# Patient Record
Sex: Female | Born: 1945 | Race: White | Hispanic: No | Marital: Married | Smoking: Former smoker
Health system: Southern US, Community
[De-identification: ages and names within clinical notes are randomized; demographics above are authoritative.]

## PROBLEM LIST (undated history)

## (undated) DIAGNOSIS — K219 Gastro-esophageal reflux disease without esophagitis: Secondary | ICD-10-CM

## (undated) DIAGNOSIS — Z860100 Personal history of colon polyps, unspecified: Secondary | ICD-10-CM

## (undated) DIAGNOSIS — E785 Hyperlipidemia, unspecified: Secondary | ICD-10-CM

## (undated) DIAGNOSIS — K649 Unspecified hemorrhoids: Secondary | ICD-10-CM

## (undated) DIAGNOSIS — Z8719 Personal history of other diseases of the digestive system: Secondary | ICD-10-CM

## (undated) DIAGNOSIS — G4730 Sleep apnea, unspecified: Secondary | ICD-10-CM

## (undated) HISTORY — PX: OTHER SURGICAL HISTORY: SHX169

## (undated) HISTORY — PX: REDUCTION MAMMAPLASTY: SUR839

## (undated) HISTORY — DX: Gastro-esophageal reflux disease without esophagitis: K21.9

## (undated) HISTORY — DX: Personal history of other diseases of the digestive system: Z87.19

## (undated) HISTORY — DX: Sleep apnea, unspecified: G47.30

## (undated) HISTORY — DX: Personal history of colonic polyps: Z86.010

## (undated) HISTORY — DX: Hyperlipidemia, unspecified: E78.5

## (undated) HISTORY — PX: CLOSED REDUCTION PROXIMAL ULNAR FRACTURE: SUR236

## (undated) HISTORY — DX: Personal history of colon polyps, unspecified: Z86.0100

## (undated) HISTORY — DX: Unspecified hemorrhoids: K64.9

## (undated) HISTORY — PX: BREAST REDUCTION SURGERY: SHX8

---

## 1985-10-11 HISTORY — PX: PARTIAL HYSTERECTOMY: SHX80

## 2000-02-26 ENCOUNTER — Encounter: Admission: RE | Admit: 2000-02-26 | Discharge: 2000-02-26 | Payer: Self-pay | Admitting: *Deleted

## 2000-04-12 ENCOUNTER — Encounter: Payer: Self-pay | Admitting: Internal Medicine

## 2000-05-03 ENCOUNTER — Ambulatory Visit (HOSPITAL_COMMUNITY): Admission: RE | Admit: 2000-05-03 | Discharge: 2000-05-03 | Payer: Self-pay | Admitting: Internal Medicine

## 2000-05-03 ENCOUNTER — Encounter: Payer: Self-pay | Admitting: Internal Medicine

## 2000-05-03 ENCOUNTER — Encounter (INDEPENDENT_AMBULATORY_CARE_PROVIDER_SITE_OTHER): Payer: Self-pay | Admitting: *Deleted

## 2001-02-13 ENCOUNTER — Other Ambulatory Visit: Admission: RE | Admit: 2001-02-13 | Discharge: 2001-02-13 | Payer: Self-pay | Admitting: *Deleted

## 2001-08-21 ENCOUNTER — Encounter: Admission: RE | Admit: 2001-08-21 | Discharge: 2001-08-21 | Payer: Self-pay

## 2001-11-07 ENCOUNTER — Other Ambulatory Visit: Admission: RE | Admit: 2001-11-07 | Discharge: 2001-11-07 | Payer: Self-pay | Admitting: Obstetrics and Gynecology

## 2004-06-30 ENCOUNTER — Other Ambulatory Visit: Admission: RE | Admit: 2004-06-30 | Discharge: 2004-06-30 | Payer: Self-pay | Admitting: Family Medicine

## 2005-07-06 ENCOUNTER — Other Ambulatory Visit: Admission: RE | Admit: 2005-07-06 | Discharge: 2005-07-06 | Payer: Self-pay | Admitting: Family Medicine

## 2006-02-22 ENCOUNTER — Encounter: Admission: RE | Admit: 2006-02-22 | Discharge: 2006-02-22 | Payer: Self-pay | Admitting: Internal Medicine

## 2007-06-15 ENCOUNTER — Encounter (INDEPENDENT_AMBULATORY_CARE_PROVIDER_SITE_OTHER): Payer: Self-pay | Admitting: Orthopedic Surgery

## 2007-06-15 ENCOUNTER — Ambulatory Visit (HOSPITAL_BASED_OUTPATIENT_CLINIC_OR_DEPARTMENT_OTHER): Admission: RE | Admit: 2007-06-15 | Discharge: 2007-06-16 | Payer: Self-pay | Admitting: Orthopedic Surgery

## 2008-04-11 ENCOUNTER — Encounter: Payer: Self-pay | Admitting: Internal Medicine

## 2008-04-23 ENCOUNTER — Telehealth: Payer: Self-pay | Admitting: Internal Medicine

## 2008-04-23 DIAGNOSIS — R109 Unspecified abdominal pain: Secondary | ICD-10-CM | POA: Insufficient documentation

## 2008-04-23 DIAGNOSIS — Z860100 Personal history of colon polyps, unspecified: Secondary | ICD-10-CM | POA: Insufficient documentation

## 2008-04-23 DIAGNOSIS — K649 Unspecified hemorrhoids: Secondary | ICD-10-CM | POA: Insufficient documentation

## 2008-04-24 ENCOUNTER — Ambulatory Visit: Payer: Self-pay | Admitting: Internal Medicine

## 2008-04-24 ENCOUNTER — Encounter: Admission: RE | Admit: 2008-04-24 | Discharge: 2008-04-24 | Payer: Self-pay | Admitting: Internal Medicine

## 2008-04-24 DIAGNOSIS — R110 Nausea: Secondary | ICD-10-CM

## 2008-04-25 ENCOUNTER — Encounter: Admission: RE | Admit: 2008-04-25 | Discharge: 2008-04-25 | Payer: Self-pay | Admitting: Internal Medicine

## 2008-04-26 ENCOUNTER — Telehealth: Payer: Self-pay | Admitting: Internal Medicine

## 2008-06-24 ENCOUNTER — Telehealth: Payer: Self-pay | Admitting: Internal Medicine

## 2008-06-26 ENCOUNTER — Ambulatory Visit: Payer: Self-pay | Admitting: Internal Medicine

## 2008-06-27 ENCOUNTER — Ambulatory Visit: Payer: Self-pay | Admitting: Internal Medicine

## 2008-06-27 ENCOUNTER — Encounter: Payer: Self-pay | Admitting: Internal Medicine

## 2008-07-01 ENCOUNTER — Encounter: Payer: Self-pay | Admitting: Internal Medicine

## 2008-08-05 ENCOUNTER — Encounter: Payer: Self-pay | Admitting: Internal Medicine

## 2008-08-05 ENCOUNTER — Telehealth: Payer: Self-pay | Admitting: Internal Medicine

## 2008-08-05 DIAGNOSIS — R1013 Epigastric pain: Secondary | ICD-10-CM

## 2008-08-06 ENCOUNTER — Ambulatory Visit: Payer: Self-pay | Admitting: Internal Medicine

## 2009-10-08 ENCOUNTER — Encounter: Admission: RE | Admit: 2009-10-08 | Discharge: 2009-10-08 | Payer: Self-pay | Admitting: Orthopedic Surgery

## 2009-10-14 ENCOUNTER — Ambulatory Visit (HOSPITAL_BASED_OUTPATIENT_CLINIC_OR_DEPARTMENT_OTHER): Admission: RE | Admit: 2009-10-14 | Discharge: 2009-10-14 | Payer: Self-pay | Admitting: Orthopedic Surgery

## 2010-04-16 ENCOUNTER — Encounter (INDEPENDENT_AMBULATORY_CARE_PROVIDER_SITE_OTHER): Payer: Self-pay | Admitting: *Deleted

## 2010-08-06 ENCOUNTER — Encounter (INDEPENDENT_AMBULATORY_CARE_PROVIDER_SITE_OTHER): Payer: Self-pay | Admitting: *Deleted

## 2010-09-30 ENCOUNTER — Encounter (INDEPENDENT_AMBULATORY_CARE_PROVIDER_SITE_OTHER): Payer: Self-pay | Admitting: *Deleted

## 2010-10-06 ENCOUNTER — Ambulatory Visit: Payer: Self-pay | Admitting: Internal Medicine

## 2010-10-28 ENCOUNTER — Telehealth: Payer: Self-pay | Admitting: Internal Medicine

## 2010-10-30 ENCOUNTER — Ambulatory Visit: Admit: 2010-10-30 | Payer: Self-pay | Admitting: Internal Medicine

## 2010-11-12 NOTE — Progress Notes (Signed)
Summary: [facility]  [facility]   Imported By: Lanelle Bal 03/26/2010 10:54:22  _____________________________________________________________________  External Attachment:    Type:   Image     Comment:   External Document

## 2010-11-12 NOTE — Miscellaneous (Signed)
Summary: [facility] previsit  Clinical Lists Changes  Medications: Added new medication of DULCOLAX 5 MG  TBEC (BISACODYL) Day before procedure take 2 at 3pm and 2 at 8pm. - Signed Added new medication of METOCLOPRAMIDE HCL 10 MG  TABS (METOCLOPRAMIDE HCL) As per prep instructions. - Signed Added new medication of MIRALAX   POWD (POLYETHYLENE GLYCOL 3350) As per prep  instructions. - Signed Rx of DULCOLAX 5 MG  TBEC (BISACODYL) Day before procedure take 2 at 3pm and 2 at 8pm.;  #4 x 0;  Signed;  Entered by: Karl Bales RN;  Authorized by: Hart Carwin MD;  Method used: Electronically to [facility]*, [address] Shipping/mailing, [place], [place]  [address], Ph: [phone], Fax: [phone] Rx of METOCLOPRAMIDE HCL 10 MG  TABS (METOCLOPRAMIDE HCL) As per prep instructions.;  #2 x 0;  Signed;  Entered by: Karl Bales RN;  Authorized by: Hart Carwin MD;  Method used: Electronically to [facility]*, [address], [address] Shipping/mailing, [place], [place]  [address], Ph: [phone], Fax: [phone] Rx of MIRALAX   POWD (POLYETHYLENE GLYCOL 3350) As per prep  instructions.;  #255gm x 0;  Signed;  Entered by: Karl Bales RN;  Authorized by: Hart Carwin MD;  Method used: Electronically to [facility]*, [address] Shipping/mailing, [place], [place]  [address], Ph: [phone], Fax: [phone]    Prescriptions: MIRALAX   POWD (POLYETHYLENE GLYCOL 3350) As per prep  instructions.  #255gm x 0   Entered by:   Karl Bales RN   Authorized by:   Hart Carwin MD   Signed by:   Karl Bales RN on 10/08/2010   Method used:   Electronically to        [facility]* (retail)       [address]       [address] Shipping/mailing       [place], [place]  [address]       Ph: [phone]       Fax: [phone]   RxID:   [id number] METOCLOPRAMIDE HCL 10 MG  TABS  (METOCLOPRAMIDE HCL) As per prep instructions.  #2 x 0   Entered by:   Karl Bales RN   Authorized by:   Hart Carwin MD   Signed by:   Karl Bales RN on 10/08/2010   Method used:   Electronically to        [facility]* (retail)       [address]       [address] Shipping/mailing       [place], [place]  [address]       Ph: [phone]       Fax: [phone]   RxID:   [id number] DULCOLAX 5 MG  TBEC (BISACODYL) Day before procedure take 2 at 3pm and 2 at 8pm.  #4 x 0   Entered by:   Karl Bales RN   Authorized by:   Hart Carwin MD   Signed by:   Karl Bales RN on 10/08/2010   Method used:   Electronically to        [facility]* (retail)       [address]       [address] Shipping/mailing       [place], [place]  [address]  Ph: [phone]       Fax: [phone]   RxID:   [id number]

## 2010-11-12 NOTE — Progress Notes (Signed)
Summary: Resch'd COL  Phone Note Call from Patient Call back at Home Phone [phone]   Caller: Patient Call For: Dr. Juanda Chance Summary of Call: pt. r/s her COL from 10-30-10 until 12-08-10 b/c her husband has to attend the [facility] on Thursday night and she would not be able to prep. Would you like pt. charged the cancelation fee? Initial call taken by: Karna Christmas,  October 28, 2010 10:55 AM  Follow-up for Phone Call        no charge, please. DB Follow-up by: Hart Carwin MD,  October 28, 2010 11:45 AM

## 2010-11-12 NOTE — Letter (Signed)
Summary: Colonoscopy Letter   Gastroenterology  [address] [place], [place] [address]   Phone: [phone]  Fax: [phone]      April 16, 2010 MRN: [id number]   [facility] [address] [place], [place]  [address]   Dear Ms. Loughner,   According to your medical record, it is time for you to schedule a Colonoscopy. The American Cancer Society recommends this procedure as a method to detect early colon cancer. Patients with a family history of colon cancer, or a personal history of colon polyps or inflammatory bowel disease are at increased risk.  This letter has beeen generated based on the recommendations made at the time of your procedure. If you feel that in your particular situation this may no longer apply, please contact our office.  Please call our office at [phone] to schedule this appointment or to update your records at your earliest convenience.  Thank you for cooperating with [place] to provide you with the very best care possible.   Sincerely,  Hedwig Morton. Juanda Chance, M.D.  [facility]astroenterology [facility] [phone]

## 2010-11-12 NOTE — Letter (Signed)
Summary: [facility] Instructions  [facility]  [address] [place], [place] [address]   Phone: [phone]  Fax: [phone]       Stacey Short    1946-02-19    MRN: [id number]       Procedure Day Dorna Bloom:  Lenor Coffin  10/22/10     Arrival Time: 9:30AM     Procedure Time:  10:30AM     Location of Procedure:                    Juliann Pares  [facility] (4th Floor)   PREPARATION FOR COLONOSCOPY WITH MIRALAX  Starting 5 days prior to your procedure 10/17/10 do not eat nuts, seeds, popcorn, corn, beans, peas,  salads, or any raw vegetables.  Do not take any fiber supplements (e.g. Metamucil, Citrucel, and Benefiber). ____________________________________________________________________________________________________   THE DAY BEFORE YOUR PROCEDURE         DATE: 10/21/10   DAY: WEDNESDAY  1   Drink clear liquids the entire day-NO SOLID FOOD  2   Do not drink anything colored red or purple.  Avoid juices with pulp.  No orange juice.  3   Drink at least 64 oz. (8 glasses) of fluid/clear liquids during the day to prevent dehydration and help the prep work efficiently.  CLEAR LIQUIDS INCLUDE: Water Jello Ice Popsicles Tea (sugar ok, no milk/cream) Powdered fruit flavored drinks Coffee (sugar ok, no milk/cream) Gatorade Juice: apple, white grape, white cranberry  Lemonade Clear bullion, consomm, broth Carbonated beverages (any kind) Strained chicken noodle soup Hard Candy  4   Mix the entire bottle of Miralax with 64 oz. of Gatorade/Powerade in the morning and put in the refrigerator to chill.  5   At 3:00 pm take 2 Dulcolax/Bisacodyl tablets.  6   At 4:30 pm take one Reglan/Metoclopramide tablet.  7  Starting at 5:00 pm drink one 8 oz glass of the Miralax mixture every 15-20 minutes until you have finished drinking the entire 64 oz.  You should finish drinking prep around 7:30 or 8:00 pm.  8   If you are nauseated, you may take the 2nd Reglan/Metoclopramide  tablet at 6:30 pm.        9    At 8:00 pm take 2 more DULCOLAX/Bisacodyl tablets.     THE DAY OF YOUR PROCEDURE      DATE:  10/22/10   DAY:  Lenor Coffin  You may drink clear liquids until 8:30AM (2 HOURS BEFORE PROCEDURE).   MEDICATION INSTRUCTIONS  Unless otherwise instructed, you should take regular prescription medications with a small sip of water as early as possible the morning of your procedure.         OTHER INSTRUCTIONS  You will need a responsible adult at least 64 years of age to accompany you and drive you home.   This person must remain in the waiting room during your procedure.  Wear loose fitting clothing that is easily removed.  Leave jewelry and other valuables at home.  However, you may wish to bring a book to read or an iPod/MP3 player to listen to music as you wait for your procedure to start.  Remove all body piercing jewelry and leave at home.  Total time from sign-in until discharge is approximately 2-3 hours.  You should go home directly after your procedure and rest.  You can resume normal activities the day after your procedure.  The day of your procedure you should not:   Drive  Make legal decisions   Operate machinery   Drink alcohol   Return to work  You will receive specific instructions about eating, activities and medications before you leave.   The above instructions have been reviewed and explained to me by   Karl Bales RN  October 08, 2010 9:29 AM    I fully understand and can verbalize these instructions _____________________________ Date _______

## 2010-11-12 NOTE — Letter (Signed)
Summary: Pre Visit Letter Revised  [facility]  [address] [place], [place] [address]   Phone: [phone]  Fax: [phone]        08/06/2010 MRN: [id number] [facility] [address] [place], [place]  [address]  [place]             Procedure Date:  09-15-10   Welcome to the Gastroenterology Division at [facility].    You are scheduled to see a nurse for your pre-procedure visit on 09-07-10 at 9:30A.M. on the 3rd floor at [facility], [address]  We ask that you try to arrive at our office 15 minutes prior to your appointment time to allow for check-in.  Please take a minute to review the attached form.  If you answer "Yes" to one or more of the questions on the first page, we ask that you call the person listed at your earliest opportunity.  If you answer "No" to all of the questions, please complete the rest of the form and bring it to your appointment.    Your nurse visit will consist of discussing your medical and surgical history, your immediate family medical history, and your medications.   If you are unable to list all of your medications on the form, please bring the medication bottles to your appointment and we will list them.  We will need to be aware of both prescribed and over the counter drugs.  We will need to know exact dosage information as well.    Please be prepared to read and sign documents such as consent forms, a financial agreement, and acknowledgement forms.  If necessary, and with your consent, a friend or relative is welcome to sit-in on the nurse visit with you.  Please bring your insurance card so that we may make a copy of it.  If your insurance requires a referral to see a specialist, please bring your referral form from your primary care physician.  No co-pay is required for this nurse visit.     If you cannot keep your appointment, please call [phone] to cancel or reschedule prior to your appointment date.  This  allows [place] the opportunity to schedule an appointment for another patient in need of care.    Thank you for choosing [facility] for your medical needs.  We appreciate the opportunity to care for you.  Please visit [place] at our website  to learn more about our practice.  Sincerely, The Gastroenterology Division

## 2010-12-07 ENCOUNTER — Telehealth: Payer: Self-pay | Admitting: Internal Medicine

## 2010-12-08 ENCOUNTER — Encounter: Payer: Self-pay | Admitting: Internal Medicine

## 2010-12-08 ENCOUNTER — Other Ambulatory Visit (AMBULATORY_SURGERY_CENTER): Payer: 59 | Admitting: Internal Medicine

## 2010-12-08 DIAGNOSIS — Z1211 Encounter for screening for malignant neoplasm of colon: Secondary | ICD-10-CM

## 2010-12-17 NOTE — Progress Notes (Signed)
Summary: Prep questions  Phone Note Call from Patient Call back at Home Phone [phone]   Caller: Patient Call For: Dr. Juanda Chance Reason for Call: Talk to Nurse Summary of Call: Patient has questions about her prep since she changed the time of her procedure Initial call taken by: [place] Johnson,  December 07, 2010 1:09 PM  Follow-up for Phone Call        Miralax prep; times therefore will not change. Follow-up by: Doristine Church RN II,  December 07, 2010 1:14 PM

## 2010-12-17 NOTE — Procedures (Signed)
Summary: Colonoscopy  Patient: [name] Note: All result statuses are Final unless otherwise noted.  Tests: (1) Colonoscopy (COL)   COL Colonoscopy           DONE     [facility]     [address]     [place], [place]  [address]           COLONOSCOPY PROCEDURE REPORT           PATIENT:  Stacey, Short  MR#:  [id number]     BIRTHDATE:  05-14-1946, 64 yrs. old  GENDER:  female     ENDOSCOPIST:  Hedwig Morton. Juanda Chance, MD     REF. BY:  Rodrigo Ran, M.D.     PROCEDURE DATE:  12/08/2010     PROCEDURE:  Colonoscopy [address]1027     ASA CLASS:  Class II     INDICATIONS:  Routine Risk Screening hyperplastic polyp in 2001     MEDICATIONS:   Versed 10 mg, Fentanyl 100 mcg           DESCRIPTION OF PROCEDURE:   After the risks benefits and     alternatives of the procedure were thoroughly explained, informed     consent was obtained.  Digital rectal exam was performed and     revealed no rectal masses.   The LB [id number]-[id number] endoscope     was introduced through the anus and advanced to the cecum, which     was identified by both the appendix and ileocecal valve, without     limitations.  The quality of the prep was good, using MiraLax.     The instrument was then slowly withdrawn as the colon was fully     examined.     <<PROCEDUREIMAGES>>           FINDINGS:  No polyps or cancers were seen (see image1, image2,     image3, image4, image5, and image6).   Retroflexed views in the     rectum revealed no abnormalities.    The scope was then withdrawn     from the patient and the procedure completed.           COMPLICATIONS:  None     ENDOSCOPIC IMPRESSION:     1) No polyps or cancers     2) Normal colonoscopy     RECOMMENDATIONS:     1) high fiber diet     REPEAT EXAM:  In 10 year(s) for.           ______________________________     Hedwig Morton. Juanda Chance, MD           CC:           n.     eSIGNED:   Hedwig Morton. Marlane Hirschmann at 12/08/2010 02:50 PM           Karmen Bongo, [id number]  Note:  An exclamation mark (!) indicates a result that was not dispersed into the flowsheet. Document Creation Date: 12/08/2010 2:50 PM _______________________________________________________________________  (1) Order result status: Final Collection or observation date-time: 12/08/2010 14:44 Requested date-time:  Receipt date-time:  Reported date-time:  Referring Physician:   Ordering Physician: Lina Sar [phone]) Specimen Source:  Source: Launa Grill Order Number: [id number] Lab site:   Appended Document: Colonoscopy    Clinical Lists Changes  Observations: Added new observation of COLONNXTDUE: 11/2020 (12/08/2010 15:45)

## 2010-12-26 LAB — POCT HEMOGLOBIN-HEMACUE: Hemoglobin: 12.2 g/dL (ref 12.0–15.0)

## 2011-02-23 NOTE — Op Note (Signed)
[name], Short               ACCOUNT NO.:  [id number]   MEDICAL RECORD NO.:  [id number]          PATIENT TYPE:  AMB   LOCATION:  DSC                          FACILITY:  [facility]   PHYSICIAN:  Katy Fitch. Sypher, M.D. DATE OF BIRTH:  1945/11/11   DATE OF PROCEDURE:  06/15/2007  DATE OF DISCHARGE:                               OPERATIVE REPORT   PREOPERATIVE DIAGNOSIS:  Painful left thumb carpometacarpal arthrosis  with plain film documentation of Eaton stage III degenerative change.   POSTOPERATIVE DIAGNOSIS:  Painful left thumb carpometacarpal arthrosis  with plain film documentation of Eaton stage III degenerative change,  with identification of large cartilaginous loose body within  carpometacarpal joint and Eaton stage IV changes.   OPERATION:  1. Left trapeziectomy.  2. Synovectomy and removal of loose bodies from left thumb      carpometacarpal joint.  3. Reconstruction of left thumb with a distally based flexor carpi      radialis intermetacarpal ligament reconstruction, augmented by a 0      Vicryl suturedesis.   OPERATING SURGEON:  Josephine Igo, M.D.   ASSISTANT:  Molly Maduro Dasnoit PA-C.   ANESTHESIA:  Left infraclavicular block, supervising anesthesiologist  Dr. Sampson Goon.   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   We have been following her in the office with identification of Eaton  stage III radiographic changes.  She has responded to splinting,  activity modification, anti-inflammatory medication and steroid  injections.   Recently she reached a point that she no longer responded to steroid  injection and splinting and requested that we proceed to reconstruction  of her left thumb CMC joint.   After informed consent, she is brought to the operating room at this  time.   Preoperatively she was noted to have a diminutive palmaris longus.  We  advised  her that we would proceed with trapeziectomy,  joint debridement  and probable intermetacarpal ligament reconstruction utilizing the  flexor carpi radialis.   Rather than using Kirschner wires for temporary stabilization, we  advised that we would use an absorbable suturedesis.   After informed consent, she is brought to the operating room at this  time.   PROCEDURE:  Stacey Short is brought to operating room and placed in  supine position on the operating table.   Following an anesthesia consult with Dr. Sampson Goon, a left  infraclavicular block was placed without complication.  Excellent  anesthesia of the left upper extremity was obtained.   Stacey Short was transferred to room 5, placed in supine position on the  table and under Dr. Jarrett Ables direct supervision, sedation provided.   The left arm was prepped with Betadine soap solution, sterilely draped.  A pneumatic tourniquet was applied to the proximal brachium.  1 gram of  Ancef was administered in the holding area and OR preoperatively.   Following exsanguination left arm with Esmarch bandage, arterial  tourniquet was inflated to 220 mmHg.   The procedure commenced with a curvilinear incision paralleling the  glabrous skin of the thenar eminence.  Subcutaneous tissues  were  carefully divided meticulously avoiding the radial superficial sensory  branches.   The thenar musculature and the most palmar branch of the abductor  pollicis longus was elevated exposing the capsule of the CMC joint.  The  capsule was noted to be edematous with a small ganglion protruding  through the capsule.   After the capsule was incised longitudinally.  The trapezium was exposed  subperiosteally with use of a 64 Beaver blade and a sharp 4-mm  osteotome.   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  This would  stage Stacey Short arthritis at [facility] stage  IV.   The trapezium was morselized with a rongeur and removed piecemeal.  Care  was taken to preserve the flexor carpi radialis tendon.  After complete  synovectomy, the beak osteophyte at the base of the thumb metacarpal was  removed with a rongeur.   Drill holes were created through the base of the index metacarpal at the  metacarpal ligament anatomic origin and to the base of thumb metacarpal.  The drill holes were enlarged to 3.5 mm by sequential hand drilling.  Approximately 40% of the flexor carpi radialis was harvested through a  short transverse incision on the volar aspect the forearm of the  musculotendinous junction.  The tendon was separated by blunt dissection  and a Carroll tendon passing forceps was used to harvest a distally  based tendon graft measuring approximately 5 mm in width.   This was passed distally into the cavity created by trapezium resection  and the flexor carpi radialis split to the level of its insertion.  The  flexor carpi radialis was then brought through the base of the index  metacarpal and secured with a 3-0 FiberWire to the dorsal periosteum and  extensor carpi radialis longus tendon insertion, creating an anchor for  the flexor carpi radialis graft.  The tendon was then brought back into  the cavity created by resection of the trapezium and use an  intermetacarpal ligament reconstruction by looping through the base of  the thumb metacarpal and being brought back for double overhand knots to  itself creating an intermetacarpal ligament reconstruction.  This was  secured with multiple interrupted sutures of 3-0 Ethilon at proper  tension to suspend the thumb at an anatomic height.   A pair of 0 Vicryl sutures were then brought from dorsal attachment at  the extensor carpi radialis longus through the base of thumb metacarpal  drill hole and tensioned on the thenar muscle fascia creating a  suturedesis suspending the thumb.   This technique has  been a very useful adjunct to intermetacarpal  ligament reconstruction without utilizing Kirschner wires.   Our goal was to have the suture absorb in approximately 8 to 10 weeks  once the tendon grafts have healed in position.   A very satisfactory suspension was achieved.  Care was taken to not  overtighten the suturedesis by ensuring that the thumb metacarpal could  be adducted against the index metacarpal fully.   After tensioning the suture, the thenar muscles were repaired to the  fascia and periosteum of the thumb metacarpal.   A very satisfactory construct was achieved.   All wounds irrigated with sterile saline followed by repair with  intradermal 3-0 Prolene and Steri-Strips.   The wounds were dressed with Xeroflo sterile gauze and a  thumb spica  splint.  There no apparent complications.   Stacey Short tolerated surgery and anesthesia well.  She is transferred to  recovery room with stable vital signs.   For aftercare she will be admitted to recovery care center for  observation of her vital signs and appropriate analgesics in the form of  p.o. and IV Dilaudid as well as IV PCA morphine as needed.  She will  provided Ancef 1 gram q.8 h. for total of three doses.      Katy Fitch Sypher, M.D.  Electronically Signed     RVS/MEDQ  D:  06/15/2007  T:  06/15/2007  Job:  [id number]   cc:   Talmadge Coventry, M.D.  Lenon Curt Chilton Si, M.D.

## 2011-02-26 NOTE — H&P (Signed)
[place]  Patient:    Stacey Short, Stacey Short                        MRN: [id number] Attending:  Hedwig Morton. Juanda Chance, M.D. [facility]                         History and Physical  ADDENDUM:  Copy of dictation to Dr. Bluford Main with [facility]. DD:  05/03/00 TD:  05/03/00 Job: [id number]

## 2011-02-26 NOTE — Procedures (Signed)
[place]  Patient:    Stacey Short, Stacey Short                        MRN: [id number] Proc. Date: 05/03/00 Attending:  Hedwig Morton. Juanda Chance, M.D. [facility]                           Procedure Report  PROCEDURE:  Upper endoscopy and colonoscopy.  ENDOSCOPIST:  Hedwig Morton. Juanda Chance, M.D. [facility]  INDICATIONS:  This 65 year old white female has a history of gastroesophageal reflux.  She has been recently on Aciphex 20 mg initially on a daily basis but has been able to taper it off to p.r.n.  She denies any dysphagia or odynophagia.  She his undergoing upper endoscopy to rule out Barretts esophagus.  She is also undergoing colonoscopy for neoplastic screening. There is no family history of colon cancer, but she had a hemoccult-positive stool on exam and has a history of hemorrhoids.  She has had occasional bright red blood per rectum.  On my physical on April 12, 2000, the initial exam showed trace hemoccult-positive stool, but on repeat, she was hemoccult negative. She has not been anemic.  ENDOSCOPE:  Fujinon single-channel endoscope.  SEDATION:  Versed 10 mg IV, Demerol 25 mg IV.  DESCRIPTION OF PROCEDURE:  The Fujinon single-channel endoscope was passed under direct vision through the posterior pharynx into the esophagus.  The patient was monitored by pulse oximeter.  Her oxygen saturations were normal. Her gag reflex was preserved.  Proximal, mid, and distal esophageal mucosa was completely normal with no evidence of acid reflux.  There was no fibrosis or erosions.  Squamocolumnar junction was somewhat irregular but essentially within normal limits.  There as nothing to suggest Barretts esophagus or hiatal hernia.  Stomach: The stomach was insufflated with air.  Gastric folds were unremarkable.  Gastric antrum showed mild erythema throughout.  It was not clear whether this was normal variation or if there was ______ .  Biopsies were taken for CLOtest.  ______ was  unremarkable.  Retroflexion of endoscope revealed normal fundus and cardia.  Duodenum:  The duodenal bulb and descending duodenum were normal.  IMPRESSION: 1. Essentially normal upper endoscopy of esophagus, stomach, and duodenum. 2. Status post CLOtest.  PLAN:  The patient is to continue Aciphex and Tums on p.r.n. basis.  No further evaluation needed at this time.  PROCEDURE:  Colonoscopy.  ENDOSCOPE:  Fujinon single-channel colonoscope.  SEDATION:   Additional Demerol 75 mg IV.  DESCRIPTION OF PROCEDURE:  Fujinon single-channel colonoscope was passed under direct vision to the sigmoid colon.  The patient was again monitored by pulse oximetry.  Her oxygen saturation remained normal.  There were small, first grade hemorrhoids in the anal canal.  Rectal tone was slightly decreased. Rectal ampulla was normal.  Rectosigmoid colon mucosa appeared normal.  There were no diverticula.  Tiny, flat-appearing polyp was found at 17 cm from the rectal os and was biopsied and removed by cold biopsy.  Another irregular nodularity of the sigmoid mucosa was noted at 20 cm from the rectum and was also removed by cold biopsy.  Splenic flexure, transverse colon, and hepatic flexure were traversed without difficulty and showed normal mucosa.  Cecal pouch was reached and showed normal distance of cecal pouch with normal ileocecal valve and appendiceal opening.  Video photographs were obtained. Colonoscope was then retracted and colon decompressed.  The patient tolerated the procedure  well.  IMPRESSION: 1. Diminutive polyps of the left colon status post polypectomy. 2. First grade hemorrhoids.  PLAN:  The hemoccult-positive stool was most likely related to the presence of hemorrhoids.  None of the lesions mentioned in the colonoscopy should cause hemoccult-positive stool.  Since she is not anemic, no further evaluation is necessary.  I would suggest screening with hemoccults on a yearly basis or  at least every two years.  Repeat colonoscopy in 10 years.  Treat hemorrhoids symptomatically with Anusol HG suppositories. DD:  05/03/00 TD:  05/03/00 Job: [id number]

## 2011-09-06 ENCOUNTER — Other Ambulatory Visit: Payer: Self-pay | Admitting: Internal Medicine

## 2011-09-06 DIAGNOSIS — Z1231 Encounter for screening mammogram for malignant neoplasm of breast: Secondary | ICD-10-CM

## 2011-09-07 ENCOUNTER — Other Ambulatory Visit: Payer: Self-pay | Admitting: Internal Medicine

## 2011-09-07 ENCOUNTER — Ambulatory Visit
Admission: RE | Admit: 2011-09-07 | Discharge: 2011-09-07 | Disposition: A | Discharge status: Home / Self Care | Payer: 59 | Source: Ambulatory Visit | Attending: Internal Medicine | Admitting: Internal Medicine

## 2011-09-07 DIAGNOSIS — Z1231 Encounter for screening mammogram for malignant neoplasm of breast: Secondary | ICD-10-CM

## 2011-09-20 ENCOUNTER — Ambulatory Visit
Admission: RE | Admit: 2011-09-20 | Discharge: 2011-09-20 | Disposition: A | Discharge status: Home / Self Care | Payer: 59 | Source: Ambulatory Visit | Attending: Internal Medicine | Admitting: Internal Medicine

## 2011-09-20 ENCOUNTER — Other Ambulatory Visit: Payer: Self-pay | Admitting: Internal Medicine

## 2011-09-20 DIAGNOSIS — Z1231 Encounter for screening mammogram for malignant neoplasm of breast: Secondary | ICD-10-CM

## 2012-01-18 ENCOUNTER — Encounter: Payer: Self-pay | Admitting: Sports Medicine

## 2012-01-18 ENCOUNTER — Ambulatory Visit (INDEPENDENT_AMBULATORY_CARE_PROVIDER_SITE_OTHER): Payer: 59 | Admitting: Sports Medicine

## 2012-01-18 VITALS — BP 138/87 | HR 72 | Ht 66.0 in | Wt 154.0 lb

## 2012-01-18 DIAGNOSIS — M722 Plantar fascial fibromatosis: Secondary | ICD-10-CM

## 2012-01-18 NOTE — Patient Instructions (Signed)
It was great to meet you today! We would like you to do the stretches/exercises we have given you two times per day every day, soak your feet in ice water for 20 min two times per day, and take either Aleve or Advil every day for several weeks. Come back to see [place] in 4-6 weeks so we can see if this has made any difference in your symptoms.

## 2012-01-18 NOTE — Assessment & Plan Note (Signed)
This is chronic and may have contributed to the os calcis injuries from change in weight bearing  Try exercises and stretches  He used heel padding whenever possible  Metatarsal pads added to shoes  We could consider custom orthotics if her pain continues but if she improves can continue with this plan  Recheck in 6 weeks

## 2012-01-18 NOTE — Progress Notes (Signed)
Patient ID: Stacey Short, female   DOB: 01/06/1946, 66 y.o.   MRN: [id number] The patient is a 66 year old female who presents today for evaluation of bilateral heel pain. She reports her heel pain began approximately 2 years ago. She initially attributed it to a single pair of shoes and got rid of these. However, over the last 2 years it has progressively worsened. She now has pain in most shoes that she wears.  The patient's pain is located on the plantar aspect of the foot directly under the calcaneus. There is no significant radiation. She reports that the right foot typically bothers her worse than the left foot. She does not report it been particularly worse in the morning, but rather that it worsens throughout the day and with walking. There is no particular radiation of the pain. She has previously suffered from plantar fasciitis and has tried the exercises that helped her recover from this to no effect.  The patient reports that his problem is significantly impacting her life if she is no longer able to walk her dogs nearly as far as she used to. She reports that she has a Morton's neuroma and wears over-the-counter metatarsal pads to good effect.  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  She has slight transverse arch collapse bilaterally.  Good achilles alignment with no obvious gait abnormalities.  Pain on palpation over the origin of the plantar fascia R>L, oscalcis on the right.  MSK [place]: Chronic plantar fascial thickening bilaterally, right thicker than left at 0.8 vs 0.62, bilateral os calcis defects right worse than left;  Rt shows some fragmentation. calcification in left PF near insertion  Assessment/Plan: 1) Chronic plantar fascietis: tx with ice baths, NSAIDs, and stretches.  Handout given. 2) Bilateral oscalsus: Rec OTC heel cups and avoiding shoes without padding.  No other specific tx at this  time.  RTC 4-6 weeks

## 2012-08-17 ENCOUNTER — Other Ambulatory Visit: Payer: Self-pay | Admitting: Internal Medicine

## 2012-08-17 DIAGNOSIS — Z1231 Encounter for screening mammogram for malignant neoplasm of breast: Secondary | ICD-10-CM

## 2012-08-17 DIAGNOSIS — Z9889 Other specified postprocedural states: Secondary | ICD-10-CM

## 2012-09-01 ENCOUNTER — Ambulatory Visit (INDEPENDENT_AMBULATORY_CARE_PROVIDER_SITE_OTHER): Payer: 59 | Admitting: Internal Medicine

## 2012-09-01 VITALS — BP 152/82 | HR 71 | Temp 98.0°F | Resp 16 | Ht 65.0 in | Wt 155.0 lb

## 2012-09-01 DIAGNOSIS — T7840XA Allergy, unspecified, initial encounter: Secondary | ICD-10-CM

## 2012-09-01 DIAGNOSIS — R21 Rash and other nonspecific skin eruption: Secondary | ICD-10-CM

## 2012-09-01 MED ORDER — PREDNISONE 10 MG PO TABS: ORAL_TABLET | ORAL | Status: DC

## 2012-09-01 MED ORDER — METHYLPREDNISOLONE ACETATE 80 MG/ML IJ SUSP
120.0000 mg | Freq: Once | INTRAMUSCULAR | Status: AC
  Administered 2012-09-01: 120 mg via INTRAMUSCULAR

## 2012-09-01 MED ORDER — ALPRAZOLAM 1 MG PO TABS: 1.0000 mg | ORAL_TABLET | Freq: Every evening | ORAL | Status: DC | PRN

## 2012-09-01 NOTE — Patient Instructions (Addendum)
Itching Itching is a symptom that can be caused by many things. These include skin problems (including infections) as well as some internal diseases.  If the itching is affecting just one area of the body, it is most likely due to a common skin problem, such as:  Poison oak and poison ivy.  Contact dermatitis (skin irritation from a plant, chemicals, fiberglass, detergents, new cosmetic, new jewelry, or other substance).  Fungus (such as athlete's foot, jock itch, or ringworm).  Head lice  Dandruff  Insect bite  Infection (such as Shingles or other virus infections). If the itching is all over (widespread), the possible causes are many. These include:   Dry skin or eczema  Heat rash  Hives  Liver disorders  Kidney disorders TREATMENT  Localized itching   Lubrication of the skin. Use an ointment or cream or other unperfumed moisturizers if the skin is dry. Apply frequently, especially after bathing.  Anti-itch medicines. These medications may help control the urge to scratch. Scratching always makes itching worse and increases the chance of getting an infection.  Cortisone creams and ointments. These help reduce the inflammation.  Antibiotics. Skin infections can cause itching. Topical or oral antibiotics may be needed for 10 to 20 days to get rid of an infection. If you can identify what caused the itching, avoid this substance in the future.  Widespread itching  The following measures may help to relieve itching regardless of the cause:   Wash the skin once with soap to remove irritants.  Bathe in tepid water with baking soda, cornstarch, or oatmeal.  Use calamine lotion (nonprescription) or a baking soda solution (1 teaspoon in 4 ounces of water on the skin).  Apply 1% hydrocortisone cream (no prescription needed). Do not use this if there might be a skin infection.  Avoid scratching.  Avoid itchy or tight-fitting clothes.  Avoid excessive heat, sweating,  scented soaps, and swimming pools.  The lubricants, anti-itch medicines, etc. noted above may be helpful for controlling symptoms. SEEK MEDICAL CARE IF:   The itching becomes severe.  Your itch is not better after 1 week of treatment. Contact your caregiver to schedule further evaluation. Document Released: 09/27/2005 Document Revised: 12/20/2011 Document Reviewed: 03/17/2007 Exit[employer] Patient Information 2013 [employer].  

## 2012-09-01 NOTE — Progress Notes (Signed)
  Subjective:    Patient ID: Stacey Short, female    DOB: 1946/01/07, 66 y.o.   MRN: [id number]  HPI Itchy, scaly rash for 1 week mostly on neck, upper back, upper arms/shoulders. No exposure hx, no fever, fatigue. No new meds. Does use advil once and a while. No blisters , no broken skun.   Review of Systems See ros scanned     Objective:   Physical Exam  Vitals reviewed. Constitutional: She is oriented to person, place, and time. She appears well-developed and well-nourished.  HENT:  Mouth/Throat: Oropharynx is clear and moist.  Eyes: EOM are normal. No scleral icterus.  Neck: Neck supple.  Cardiovascular: Normal rate, regular rhythm and normal heart sounds.   Pulmonary/Chest: Effort normal and breath sounds normal.  Neurological: She is alert and oriented to person, place, and time. Coordination normal.  Skin: Skin is intact. Rash noted. No burn and no petechiae noted. Rash is macular and maculopapular. Rash is not nodular and not urticarial. There is erythema.       Rash is re plaques early, then scaly brown rough pigmented older lesions  Psychiatric: She has a normal mood and affect. Her behavior is normal.          Assessment & Plan:  Depomedrol Loratidine/cetirizine Prednisone 6d taper---SED Alprazolam1mg  prn agitation Dermatology prn

## 2012-09-26 ENCOUNTER — Ambulatory Visit: Payer: 59

## 2012-09-29 ENCOUNTER — Ambulatory Visit: Payer: 59

## 2012-11-03 ENCOUNTER — Encounter: Payer: Self-pay | Admitting: Internal Medicine

## 2012-11-03 ENCOUNTER — Other Ambulatory Visit: Payer: Self-pay | Admitting: Internal Medicine

## 2012-11-03 NOTE — Telephone Encounter (Signed)
OK to fill Aciphex 20 mg, #30, 1 po qd

## 2012-11-03 NOTE — Telephone Encounter (Signed)
Dr Juanda Chance, I dont see where we ever gave this patient a script of Aciphex but she states that she needs refill on it. Do you want me to fill rx?

## 2012-11-06 MED ORDER — RABEPRAZOLE SODIUM 20 MG PO TBEC: 20.0000 mg | DELAYED_RELEASE_TABLET | Freq: Every day | ORAL | Status: DC

## 2012-11-06 NOTE — Telephone Encounter (Signed)
Sent Aciphex rx to [facility]. Patient advised.

## 2012-11-13 ENCOUNTER — Telehealth: Payer: Self-pay | Admitting: Internal Medicine

## 2012-11-14 ENCOUNTER — Encounter: Payer: Self-pay | Admitting: *Deleted

## 2012-11-14 ENCOUNTER — Other Ambulatory Visit (INDEPENDENT_AMBULATORY_CARE_PROVIDER_SITE_OTHER): Payer: 59

## 2012-11-14 ENCOUNTER — Ambulatory Visit (INDEPENDENT_AMBULATORY_CARE_PROVIDER_SITE_OTHER): Payer: 59 | Admitting: Physician Assistant

## 2012-11-14 VITALS — BP 120/84 | HR 78 | Ht 66.0 in | Wt 153.0 lb

## 2012-11-14 DIAGNOSIS — Z860100 Personal history of colon polyps, unspecified: Secondary | ICD-10-CM

## 2012-11-14 DIAGNOSIS — R1013 Epigastric pain: Secondary | ICD-10-CM

## 2012-11-14 DIAGNOSIS — Z8719 Personal history of other diseases of the digestive system: Secondary | ICD-10-CM

## 2012-11-14 LAB — COMPREHENSIVE METABOLIC PANEL
AST: 20 U/L (ref 0–37)
Alkaline Phosphatase: 44 U/L (ref 39–117)
BUN: 13 mg/dL (ref 6–23)
Glucose, Bld: 97 mg/dL (ref 70–99)
Sodium: 137 mEq/L (ref 135–145)
Total Bilirubin: 0.6 mg/dL (ref 0.3–1.2)

## 2012-11-14 LAB — CBC WITH DIFFERENTIAL/PLATELET
Eosinophils Absolute: 0.1 10*3/uL (ref 0.0–0.7)
Eosinophils Relative: 2.7 % (ref 0.0–5.0)
HCT: 39.8 % (ref 36.0–46.0)
Lymphs Abs: 1.2 10*3/uL (ref 0.7–4.0)
MCHC: 34.3 g/dL (ref 30.0–36.0)
MCV: 95.1 fl (ref 78.0–100.0)
Monocytes Absolute: 0.4 10*3/uL (ref 0.1–1.0)
Neutrophils Relative %: 65.5 % (ref 43.0–77.0)
Platelets: 198 10*3/uL (ref 150.0–400.0)
WBC: 5.1 10*3/uL (ref 4.5–10.5)

## 2012-11-14 LAB — LIPASE: Lipase: 27 U/L (ref 11.0–59.0)

## 2012-11-14 MED ORDER — SUCRALFATE 1 G PO TABS: ORAL_TABLET | ORAL | Status: DC

## 2012-11-14 NOTE — Progress Notes (Signed)
Reviewed and agree, not clear  From the past if low grade pancreatitis ( EtOH) or peptic symptoms.

## 2012-11-14 NOTE — Patient Instructions (Addendum)
Please go to the basement level to have your labs drawn.  We sent a prescription for Carafate to [facility]. Continue the Aciphex twice daily.  We scheduled the Ultrasound for Thursday 11-16-2012 at [facility] on the 1st floor. Arrive at 7:45 PM  Have nothing to eat or drink after midnight.  You have been scheduled for an endoscopy with propofol. Please follow written instructions given to you at your visit today. If you use inhalers (even only as needed) or a CPAP machine, please bring them with you on the day of your procedure.

## 2012-11-14 NOTE — Progress Notes (Signed)
Subjective:    Patient ID: Stacey Short, female    DOB: 12/11/1945, 67 y.o.   MRN: [id number]  HPI Stacey Short is a very nice 67 year old white female known to Dr. Lina Sar. She last had colonoscopy in February of 2012 which was a normal exam and had an endoscopy for epigastric pain in September of 2009 which showed an acute gastritis. Biopsies were done was negative for H. pylori on the biopsy showed mild chronic gastritis. She is otherwise in good health.  She comes in today with complaints of epigastric pain over the past 3 weeks. She says the pain is constant and achy in nature. She says it feels better initially after eating but then comes right back. She's not having any radiation of her pain into her chest or back, no nausea or vomiting, no fever or chills, and no change in her bowel habits. She denies any nausea or vomiting. She had not been on any new medications antibiotics anti-inflammatories etc. prior to onset of her symptoms. She started on AcipHex and quickly bumped this to twice a day over the past week and a half but has had no improvement in her pain. She's also been trying times which have not offered any relief and has cut back on alcohol as well.    Review of Systems  Constitutional: Negative.   HENT: Negative.   Eyes: Negative.   Respiratory: Negative.   Cardiovascular: Negative.   Gastrointestinal: Positive for abdominal pain.  Genitourinary: Negative.   Musculoskeletal: Negative.   Neurological: Negative.   Hematological: Negative.   Psychiatric/Behavioral: Negative.    Outpatient Encounter Prescriptions as of 11/14/2012  Medication Sig Dispense Refill  . ALPRAZolam (XANAX) 1 MG tablet Take 1 tablet (1 mg total) by mouth at bedtime as needed for sleep.  60 tablet  3  . Misc Natural Products (GLUCOSAMINE CHOND COMPLEX/MSM PO) Take 2 tablets by mouth daily.      . Multiple Vitamins-Minerals (MULTIVITAMIN PO) Take by mouth.      . RABEprazole (ACIPHEX) 20 MG tablet  Take 20 mg by mouth 2 (two) times daily.      . rosuvastatin (CRESTOR) 10 MG tablet Take 5 mg every other day, and 10 mg other days.      Marland Kitchen VITAMIN D, CHOLECALCIFEROL, PO Take 1 tablet by mouth daily.      Marland Kitchen zolpidem (AMBIEN) 10 MG tablet Take 5 mg by mouth at bedtime as needed.      . [DISCONTINUED] RABEprazole (ACIPHEX) 20 MG tablet Take 1 tablet (20 mg total) by mouth daily.  30 tablet  1  . sucralfate (CARAFATE) 1 G tablet Take 1 tab between meals and at bedtime x 14 days  56 tablet  1  . [DISCONTINUED] predniSONE (DELTASONE) 10 MG tablet Take po pc 6-5-4-3-2-1 ea day for allergic rash  21 tablet  0    Allergies  Allergen Reactions  . Celecoxib     REACTION: swelling  . Codeine     REACTION: nausea   Patient Active Problem List  Diagnosis  . HEMORRHOIDS  . Plantar fasciitis, bilateral  . Hx of colonic polyp  . H/O gastritis   History  Substance Use Topics  . Smoking status: Former Smoker    Quit date: 10/11/1982  . Smokeless tobacco: Never Used  . Alcohol Use: Yes   family history includes Diabetes in her sister; Kidney cancer in her sister; and Lymphoma in her father.  There is no history of Colon cancer.  Objective:   Physical Exam  well-developed white female in no acute distress blood pressure 120/84 pulse 78 height 5 foot 6 weight 153. HEENT; nontraumatic normocephalic EOMI PERRLA sclera anicteric,Neck; Supple no JVD, Cardiovascular ;regular rate and rhythm with S1-S2 no murmur or gallop, Pulmonary; clear bilaterally, Abdomen ;soft nondistended bowel sounds are active there is no palpable mass or hepatosplenomegaly she is tender in the epigastrium no guarding. Rectal; exam not done, capture remedies no clubbing cyanosis or edema skin warm and dry, Psych ;mood and affect normal and appropriate        Assessment & Plan:  #67  67 year old female with 3 week history of constant epigastric pain, thus far unrelieved with twice-daily PPI. Cannot rule out peptic ulcer  disease or gastritis though would hope she would be feeling better at this point with twice-daily acid blocker. Will also need to rule out gallbladder or pancreatic disease. #2 colon neoplasia screening-up-to-date on colonoscopy with normal exam February 2012  Plan; CBC with differential, CMETand lipase today Continue AcipHex 20 mg by mouth twice daily before breakfast and before dinner Start Carafate 1 g between meals and at bedtime x2 weeks Avoid all anti-inflammatories and alcohol Schedule for upper abdominal ultrasound Schedule for upper endoscopy with Dr. Juanda Chance . Procedure discussed in detail with the patient and she is agreeable to proceed.

## 2012-11-14 NOTE — Telephone Encounter (Signed)
Spoke with patient and she report a stomach ache x 3 weeks. States she is having pain in upper abdomen under her breasts. Hurts more if she does not eat. She is also belching a lot. She is taking Aciphex but it is not helping. Scheduled with Mike Gip, PA on 11/14/12 at 3:00 PM.

## 2012-11-15 ENCOUNTER — Other Ambulatory Visit: Payer: Self-pay | Admitting: *Deleted

## 2012-11-15 ENCOUNTER — Ambulatory Visit (HOSPITAL_COMMUNITY)
Admission: RE | Admit: 2012-11-15 | Discharge: 2012-11-15 | Disposition: A | Discharge status: Home / Self Care | Payer: 59 | Source: Ambulatory Visit | Attending: Physician Assistant | Admitting: Physician Assistant

## 2012-11-15 DIAGNOSIS — R1013 Epigastric pain: Secondary | ICD-10-CM | POA: Insufficient documentation

## 2012-11-15 DIAGNOSIS — N289 Disorder of kidney and ureter, unspecified: Secondary | ICD-10-CM | POA: Insufficient documentation

## 2012-11-16 ENCOUNTER — Ambulatory Visit (HOSPITAL_COMMUNITY): Payer: 59

## 2012-11-22 ENCOUNTER — Ambulatory Visit (AMBULATORY_SURGERY_CENTER): Payer: 59 | Admitting: Internal Medicine

## 2012-11-22 ENCOUNTER — Encounter: Payer: Self-pay | Admitting: Internal Medicine

## 2012-11-22 VITALS — BP 122/63 | HR 66 | Temp 95.9°F | Resp 17 | Ht 66.0 in | Wt 153.0 lb

## 2012-11-22 DIAGNOSIS — R1013 Epigastric pain: Secondary | ICD-10-CM

## 2012-11-22 DIAGNOSIS — D126 Benign neoplasm of colon, unspecified: Secondary | ICD-10-CM

## 2012-11-22 DIAGNOSIS — K319 Disease of stomach and duodenum, unspecified: Secondary | ICD-10-CM

## 2012-11-22 DIAGNOSIS — K208 Other esophagitis: Secondary | ICD-10-CM

## 2012-11-22 MED ORDER — RABEPRAZOLE SODIUM 20 MG PO TBEC: 20.0000 mg | DELAYED_RELEASE_TABLET | Freq: Every day | ORAL | Status: DC

## 2012-11-22 MED ORDER — RABEPRAZOLE SODIUM 20 MG PO TBEC: 20.0000 mg | DELAYED_RELEASE_TABLET | Freq: Two times a day (BID) | ORAL | Status: DC

## 2012-11-22 MED ORDER — SODIUM CHLORIDE 0.9 % IV SOLN: 500.0000 mL | INTRAVENOUS | Status: DC

## 2012-11-22 MED ORDER — HYOSCYAMINE SULFATE 0.125 MG SL SUBL: 0.1250 mg | SUBLINGUAL_TABLET | SUBLINGUAL | Status: DC | PRN

## 2012-11-22 MED ORDER — TRAMADOL HCL 50 MG PO TABS: 50.0000 mg | ORAL_TABLET | Freq: Four times a day (QID) | ORAL | Status: DC | PRN

## 2012-11-22 NOTE — Patient Instructions (Addendum)
YOU HAD AN ENDOSCOPIC PROCEDURE TODAY AT THE [facility]: Refer to the procedure report that was given to you for any specific questions about what was found during the examination.  If the procedure report does not answer your questions, please call your gastroenterologist to clarify.  If you requested that your care partner not be given the details of your procedure findings, then the procedure report has been included in a sealed envelope for you to review at your convenience later.  YOU SHOULD EXPECT: Some feelings of bloating in the abdomen. Passage of more gas than usual.  Walking can help get rid of the air that was put into your GI tract during the procedure and reduce the bloating. If you had a lower endoscopy (such as a colonoscopy or flexible sigmoidoscopy) you may notice spotting of blood in your stool or on the toilet paper. If you underwent a bowel prep for your procedure, then you may not have a normal bowel movement for a few days.  DIET: Your first meal following the procedure should be a light meal and then it is ok to progress to your normal diet.  A half-sandwich or bowl of soup is an example of a good first meal.  Heavy or fried foods are harder to digest and may make you feel nauseous or bloated.  Likewise meals heavy in dairy and vegetables can cause extra gas to form and this can also increase the bloating.  Drink plenty of fluids but you should avoid alcoholic beverages for 24 hours.  ACTIVITY: Your care partner should take you home directly after the procedure.  You should plan to take it easy, moving slowly for the rest of the day.  You can resume normal activity the day after the procedure however you should NOT DRIVE or use heavy machinery for 24 hours (because of the sedation medicines used during the test).    SYMPTOMS TO REPORT IMMEDIATELY: A gastroenterologist can be reached at any hour.  During normal business hours, 8:30 AM to 5:00 PM Monday through Friday,  call [phone].  After hours and on weekends, please call the GI answering service at [phone] who will take a message and have the physician on call contact you.   Following upper endoscopy (EGD)  Vomiting of blood or coffee ground material  New chest pain or pain under the shoulder blades  Painful or persistently difficult swallowing  New shortness of breath  Fever of 100F or higher  Black, tarry-looking stools  FOLLOW UP: If any biopsies were taken you will be contacted by phone or by letter within the next 1-3 weeks.  Call your gastroenterologist if you have not heard about the biopsies in 3 weeks.  Our staff will call the home number listed on your records the next business day following your procedure to check on you and address any questions or concerns that you may have at that time regarding the information given to you following your procedure. This is a courtesy call and so if there is no answer at the home number and we have not heard from you through the emergency physician on call, we will assume that you have returned to your regular daily activities without incident.  SIGNATURES/CONFIDENTIALITY: You and/or your care partner have signed paperwork which will be entered into your electronic medical record.  These signatures attest to the fact that that the information above on your After Visit Summary has been reviewed and is understood.  Full responsibility   of the confidentiality of this discharge information lies with you and/or your care-partner.  Wait for pathology report  Continue AcipHex  No alcohol or Nsaids  Strict low-fat diet  Tramadol for pain  Follow-up in office in 3-4 weeks  Consider CT scan of ABD or MRCP to assess for changes of chronic pancreatitis  Levsin sublingually 0.125 mg every morning  Fat and Cholesterol Control Diet Cholesterol levels in your body are determined significantly by your diet. Cholesterol levels may also be related to  heart disease. The following material helps to explain this relationship and discusses what you can do to help keep your heart healthy. Not all cholesterol is bad. Low-density lipoprotein (LDL) cholesterol is the "bad" cholesterol. It may cause fatty deposits to build up inside your arteries. High-density lipoprotein (HDL) cholesterol is "good." It helps to remove the "bad" LDL cholesterol from your blood. Cholesterol is a very important risk factor for heart disease. Other risk factors are high blood pressure, smoking, stress, heredity, and weight. The heart muscle gets its supply of blood through the coronary arteries. If your LDL cholesterol is high and your HDL cholesterol is low, you are at risk for having fatty deposits build up in your coronary arteries. This leaves less room through which blood can flow. Without sufficient blood and oxygen, the heart muscle cannot function properly and you may feel chest pains (angina pectoris). When a coronary artery closes up entirely, a part of the heart muscle may die causing a heart attack (myocardial infarction). CHECKING CHOLESTEROL When your caregiver sends your blood to a lab to be examined for cholesterol, a complete lipid (fat) profile may be done. With this test, the total amount of cholesterol and levels of LDL and HDL are determined. Triglycerides are a type of fat that circulates in the blood. They can also be used to determine heart disease risk. The list below describes what the numbers should be: Test: Total Cholesterol.  Less than 200 mg/dl. Test: LDL "bad cholesterol."  Less than 100 mg/dl.  Less than 70 mg/dl if you are at very high risk of a heart attack or sudden cardiac death. Test: HDL "good cholesterol."  Greater than 50 mg/dl for women.  Greater than 40 mg/dl for men. Test: Triglycerides.  Less than 150 mg/dl. CONTROLLING CHOLESTEROL WITH DIET Although exercise and lifestyle factors are important, your diet is key. That is  because certain foods are known to raise cholesterol and others to lower it. The goal is to balance foods for their effect on cholesterol and more importantly, to replace saturated and trans fat with other types of fat, such as monounsaturated fat, polyunsaturated fat, and omega-3 fatty acids. On average, a person should consume no more than 15 to 17 g of saturated fat daily. Saturated and trans fats are considered "bad" fats, and they will raise LDL cholesterol. Saturated fats are primarily found in animal products such as meats, butter, and cream. However, that does not mean you need to give up all your favorite foods. Today, there are good tasting, low-fat, low-cholesterol substitutes for most of the things you like to eat. Choose low-fat or nonfat alternatives. Choose round or loin cuts of red meat. These types of cuts are lowest in fat and cholesterol. Chicken (without the skin), fish, veal, and ground [place] breast are great choices. Eliminate fatty meats, such as hot dogs and salami. Even shellfish have little or no saturated fat. Have a 3 oz (85 g) portion when you eat lean meat, poultry,  or fish. Trans fats are also called "partially hydrogenated oils." They are oils that have been scientifically manipulated so that they are solid at room temperature resulting in a longer shelf life and improved taste and texture of foods in which they are added. Trans fats are found in stick margarine, some tub margarines, cookies, crackers, and baked goods.  When baking and cooking, oils are a great substitute for butter. The monounsaturated oils are especially beneficial since it is believed they lower LDL and raise HDL. The oils you should avoid entirely are saturated tropical oils, such as coconut and palm.  Remember to eat a lot from food groups that are naturally free of saturated and trans fat, including fish, fruit, vegetables, beans, grains (barley, rice, couscous, bulgur wheat), and pasta (without cream  sauces).  IDENTIFYING FOODS THAT LOWER CHOLESTEROL  Soluble fiber may lower your cholesterol. This type of fiber is found in fruits such as apples, vegetables such as broccoli, potatoes, and carrots, legumes such as beans, peas, and lentils, and grains such as barley. Foods fortified with plant sterols (phytosterol) may also lower cholesterol. You should eat at least 2 g per day of these foods for a cholesterol lowering effect.  Read package labels to identify low-saturated fats, trans fat free, and low-fat foods at the supermarket. Select cheeses that have only 2 to 3 g saturated fat per ounce. Use a heart-healthy tub margarine that is free of trans fats or partially hydrogenated oil. When buying baked goods (cookies, crackers), avoid partially hydrogenated oils. Breads and muffins should be made from whole grains (whole-wheat or whole oat flour, instead of "flour" or "enriched flour"). Buy non-creamy canned soups with reduced salt and no added fats.  FOOD PREPARATION TECHNIQUES  Never deep-fry. If you must fry, either stir-fry, which uses very little fat, or use non-stick cooking sprays. When possible, broil, bake, or roast meats, and steam vegetables. Instead of putting butter or margarine on vegetables, use lemon and herbs, applesauce, and cinnamon (for squash and sweet potatoes), nonfat yogurt, salsa, and low-fat dressings for salads.  LOW-SATURATED FAT / LOW-FAT FOOD SUBSTITUTES Meats / Saturated Fat (g)  Avoid: Steak, marbled (3 oz/85 g) / 11 g  Choose: Steak, lean (3 oz/85 g) / 4 g  Avoid: Hamburger (3 oz/85 g) / 7 g  Choose: Hamburger, lean (3 oz/85 g) / 5 g  Avoid: Ham (3 oz/85 g) / 6 g  Choose: Ham, lean cut (3 oz/85 g) / 2.4 g  Avoid: Chicken, with skin, dark meat (3 oz/85 g) / 4 g  Choose: Chicken, skin removed, dark meat (3 oz/85 g) / 2 g  Avoid: Chicken, with skin, light meat (3 oz/85 g) / 2.5 g  Choose: Chicken, skin removed, light meat (3 oz/85 g) / 1 g Dairy / Saturated  Fat (g)  Avoid: Whole milk (1 cup) / 5 g  Choose: Low-fat milk, 2% (1 cup) / 3 g  Choose: Low-fat milk, 1% (1 cup) / 1.5 g  Choose: Skim milk (1 cup) / 0.3 g  Avoid: Hard cheese (1 oz/28 g) / 6 g  Choose: Skim milk cheese (1 oz/28 g) / 2 to 3 g  Avoid: Cottage cheese, 4% fat (1 cup) / 6.5 g  Choose: Low-fat cottage cheese, 1% fat (1 cup) / 1.5 g  Avoid: Ice cream (1 cup) / 9 g  Choose: Sherbet (1 cup) / 2.5 g  Choose: Nonfat frozen yogurt (1 cup) / 0.3 g  Choose: Frozen fruit bar /  trace  Avoid: Whipped cream (1 tbs) / 3.5 g  Choose: Nondairy whipped topping (1 tbs) / 1 g Condiments / Saturated Fat (g)  Avoid: Mayonnaise (1 tbs) / 2 g  Choose: Low-fat mayonnaise (1 tbs) / 1 g  Avoid: Butter (1 tbs) / 7 g  Choose: Extra light margarine (1 tbs) / 1 g  Avoid: Coconut oil (1 tbs) / 11.8 g  Choose: Olive oil (1 tbs) / 1.8 g  Choose: Corn oil (1 tbs) / 1.7 g  Choose: Safflower oil (1 tbs) / 1.2 g  Choose: Sunflower oil (1 tbs) / 1.4 g  Choose: Soybean oil (1 tbs) / 2.4 g  Choose: Canola oil (1 tbs) / 1 g Document Released: 09/27/2005 Document Revised: 12/20/2011 Document Reviewed: 03/18/2011 [facility] Patient Information 2013 [place], [place].

## 2012-11-22 NOTE — Progress Notes (Signed)
Lidocaine-40mg IV prior to Propofol InductionPropofol given over incremental dosages 

## 2012-11-22 NOTE — Op Note (Signed)
[facility] [address] [place], [address]   ENDOSCOPY PROCEDURE REPORT  PATIENT: Stacey Short, Stacey Short  MR#: [id number] BIRTHDATE: December 31, 1945 , 66  yrs. old GENDER: Female ENDOSCOPIST: Hart Carwin, MD REFERRED BY:  Rodrigo Ran, M.D. PROCEDURE DATE:  11/22/2012 PROCEDURE:  EGD w/ biopsy ASA CLASS:     Class II INDICATIONS:  Epigastric pain.   hx of abdominal pain in the past, current episode started 4 weeks ago, refractory to PPI's, abd. sono- 7 mm CBD, lipase normal. MEDICATIONS: MAC sedation, administered by CRNA and propofol (Diprivan) 150mg  IV TOPICAL ANESTHETIC: Cetacaine Spray  DESCRIPTION OF PROCEDURE: After the risks benefits and alternatives of the procedure were thoroughly explained, informed consent was obtained.  The LB GIF-H180 [id number] endoscope was introduced through the mouth and advanced to the second portion of the duodenum. Without limitations.  The instrument was slowly withdrawn as the mucosa was fully examined.      ESPHAGUS: esophageal mucosa appeared normal. The Z line was slightly irregular. Biopsies were taken to rule out Barrett's esophagus. There was no evidence of esophagitis or esophageal stricture. There was no hiatal hernia Stomach: stomach was insufflated with air and showed normal rugal folds and minimal erythema in the gastric antrum biopsies were taken for H. pylori. There were no erosions. Pyloric outlet was unremarkable. There was no retained food. Retroflexion of the endoscope revealed normal fundus and cardia Duodenum: duodenal bulb and descending duodenum was unremarkable there was no evidence of stricture or inflammatory changes in the descending duodenum[          The scope was then withdrawn from the patient and the procedure completed.  COMPLICATIONS: There were no complications. ENDOSCOPIC IMPRESSIN  essentially normal upper endoscopy of esophagus stomach and duodenum with the minimal irregularity of the GE  junction. Biopsies obtained Mild erythema of the gastric antrum consistent with mild gastritis. Status post biopsies for H. pyloric  Nothing to account for abdominal pain RECOMMENDATIONS:  Await pathology results consider CT scan of the abdomen or MRCP to assess for changes of chronic pancreatitis. Continue AcipHex No alcohol Or NSAID's Levsin sublingually 0.125 mg a.c. Strict low-fat diet Tramadol for pain Followup in the office in 3-4 weeks   REPEAT EXAM: no recall  eSigned:  Hart Carwin, MD 11/22/2012 8:31 AM   CC:  PATIENT NAME:  Stacey Short, Stacey Short MR#: [id number]

## 2012-11-22 NOTE — Progress Notes (Signed)
Called to room to assist during endoscopic procedure.  Patient ID and intended procedure confirmed with present staff. Received instructions for my participation in the procedure from the performing physician.  

## 2012-11-22 NOTE — Progress Notes (Signed)
Called down stairs made appointment for 9am Tues. March 4th    Patient did not experience any of the following events: a burn prior to discharge; a fall within the facility; wrong site/side/patient/procedure/implant event; or a hospital transfer or hospital admission upon discharge from the facility. ([id number])Patient did not have preoperative order for IV antibiotic SSI prophylaxis. [phone])

## 2012-11-22 NOTE — Progress Notes (Signed)
Pt requested prescriptions to be called into pharmacy, [facility] and order tramadol and levsin, aciphex was sent efile.

## 2012-11-25 ENCOUNTER — Other Ambulatory Visit: Payer: Self-pay

## 2012-11-27 ENCOUNTER — Encounter: Payer: Self-pay | Admitting: *Deleted

## 2012-11-27 ENCOUNTER — Telehealth: Payer: Self-pay | Admitting: *Deleted

## 2012-11-27 NOTE — Telephone Encounter (Signed)
  Follow up Call-  Call back number 11/22/2012  Post procedure Call Back phone  # [phone]  Permission to leave phone message Yes     Patient questions:  Do you have a fever, pain , or abdominal swelling? no Pain Score  0 *  Have you tolerated food without any problems? yes  Have you been able to return to your normal activities? yes  Do you have any questions about your discharge instructions: Diet   no Medications  no Follow up visit  no  Do you have questions or concerns about your Care? no  Actions: * If pain score is 4 or above: No action needed, pain <4.

## 2012-11-28 ENCOUNTER — Encounter: Payer: Self-pay | Admitting: Internal Medicine

## 2012-12-11 ENCOUNTER — Telehealth: Payer: Self-pay | Admitting: Internal Medicine

## 2012-12-11 NOTE — Telephone Encounter (Signed)
Per Dr. Juanda Chance patient is coming tomorrow at 9:45 AM for 10:00 AM OV.

## 2012-12-12 ENCOUNTER — Other Ambulatory Visit (INDEPENDENT_AMBULATORY_CARE_PROVIDER_SITE_OTHER): Payer: 59

## 2012-12-12 ENCOUNTER — Encounter: Payer: Self-pay | Admitting: Internal Medicine

## 2012-12-12 ENCOUNTER — Ambulatory Visit (INDEPENDENT_AMBULATORY_CARE_PROVIDER_SITE_OTHER): Payer: 59 | Admitting: Internal Medicine

## 2012-12-12 VITALS — BP 100/68 | HR 76 | Ht 66.0 in | Wt 150.6 lb

## 2012-12-12 DIAGNOSIS — R1011 Right upper quadrant pain: Secondary | ICD-10-CM

## 2012-12-12 DIAGNOSIS — R932 Abnormal findings on diagnostic imaging of liver and biliary tract: Secondary | ICD-10-CM

## 2012-12-12 LAB — HEPATIC FUNCTION PANEL
ALT: 17 U/L (ref 0–35)
AST: 16 U/L (ref 0–37)
Albumin: 4.1 g/dL (ref 3.5–5.2)
Alkaline Phosphatase: 45 U/L (ref 39–117)
Total Bilirubin: 0.8 mg/dL (ref 0.3–1.2)

## 2012-12-12 LAB — CANCER ANTIGEN 19-9: CA 19-9: 13.1 U/mL (ref <35.0)

## 2012-12-12 NOTE — Patient Instructions (Addendum)
You have been scheduled for a HIDA scan at [facility] (1st floor) on Thursday 12/14/12. Please arrive 15 minutes prior to your scheduled appointment on 12:30 pm. Make certain not to have anything to eat or drink at least 6 hours prior to your test. Should this appointment date or time not work well for you, please call radiology scheduling at [phone].  _____________________________________________________________________ hepatobiliary (HIDA) scan is an imaging procedure used to diagnose problems in the liver, gallbladder and bile ducts. In the HIDA scan, a radioactive chemical or tracer is injected into a vein in your arm. The tracer is handled by the liver like bile. Bile is a fluid produced and excreted by your liver that helps your digestive system break down fats in the foods you eat. Bile is stored in your gallbladder and the gallbladder releases the bile when you eat a meal. A special nuclear medicine scanner (gamma camera) tracks the flow of the tracer from your liver into your gallbladder and small intestine.  During your HIDA scan  You'll be asked to change into a hospital gown before your HIDA scan begins. Your health care team will position you on a table, usually on your back. The radioactive tracer is then injected into a vein in your arm.The tracer travels through your bloodstream to your liver, where it's taken up by the bile-producing cells. The radioactive tracer travels with the bile from your liver into your gallbladder and through your bile ducts to your small intestine.You may feel some pressure while the radioactive tracer is injected into your vein. As you lie on the table, a special gamma camera is positioned over your abdomen taking pictures of the tracer as it moves through your body. The gamma camera takes pictures continually for about an hour. You'll need to keep still during the HIDA scan. This can become uncomfortable, but you may find that you can lessen the  discomfort by taking deep breaths and thinking about other things. Tell your health care team if you're uncomfortable. The radiologist will watch on a computer the progress of the radioactive tracer through your body. The HIDA scan may be stopped when the radioactive tracer is seen in the gallbladder and enters your small intestine. This typically takes about an hour. In some cases extra imaging will be performed if original images aren't satisfactory, if morphine is given to help visualize the gallbladder or if the medication CCK is given to look at the contraction of the gallbladder. This test typically takes 2 hours to complete. ________________________________________________________________________  Your physician has requested that you go to the basement for the following lab work before leaving today: CA 19-9, Sed Rate, LFT's, Amylase, Lipase  CC: Dr Waynard Edwards

## 2012-12-12 NOTE — Progress Notes (Signed)
Stacey Short Dec 13, 1945 MRN [id number]   History of Present Illness:  This is a 67 year old white female with persistent upper abdominal pain. This time the pain has shifted to the right upper quadrant. She is also having right costovertebral angle pain. She had a recent upper endoscopy which showed mild gastritis. She was H. pylori negative. She has been treated with AcipHex 20 mg twice a day with mild improvement of her symptoms. Liver function tests and pancreatic tests have been negative. She had similar problems in 2009 when her CT scan of the abdomen showed a 6.7 mm common bile duct. A recent upper abdominal ultrasound also showed  A borderline dilated common bile duct at 7 mm. Her spleen is normal at 8.5 cm. 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 She needs a repeat ultrasound in 6 months. The pain seems to be worse after eating. There has been no fever or vomiting. She has dramatically cut back on her alcohol intake. She has a positive family history of gallbladder disease in 2 of her sisters. An upper GI series in July 2009 was negative. She is up-to-date on her colonoscopy.   Past Medical History  Diagnosis Date  . COLONIC POLYPS, HYPERPLASTIC, HX OF   . Hyperlipidemia   . Unspecified hemorrhoids without mention of complication   . H/O gastritis     egd 2009  ,h.pylori negative   Past Surgical History  Procedure Laterality Date  . Partial hysterectomy    . Thumb joint repair    . Breast reduction surgery    . Tummy tuck      reports that she quit smoking about 30 years ago. She has never used smokeless tobacco. She reports that  drinks alcohol. She reports that she does not use illicit drugs. family history includes Diabetes in her mother and sister; Kidney cancer in her sister; and Lymphoma in her father.  There is no history of Colon cancer. Allergies  Allergen Reactions  . Celecoxib     REACTION: swelling  .  Codeine     REACTION: nausea        Review of Systems:  The remainder of the 10 point ROS is negative except as outlined in H&P   Physical Exam: General appearance  Well developed, in no distress. Eyes- non icteric. HEENT nontraumatic, normocephalic. Mouth no lesions, tongue papillated, no cheilosis. Neck supple without adenopathy, thyroid not enlarged, no carotid bruits, no JVD. Lungs Clear to auscultation bilaterally. Cor normal S1, normal S2, regular rhythm, no murmur,  quiet precordium. Abdomen: Soft. Hyperactive bowel sounds. Tenderness in right upper quadrant. When tapping on the  liver ,precipitated her pain upper quadrant ,left upper abdomen was  unremarkable. Lower abdomen unremarkable. No CVA tenderness. Rectal: Not done. Extremities no pedal edema. Skin no lesions. Neurological alert and oriented x 3. Psychological normal mood and affect.  Assessment and Plan:  Persistent upper abdominal pain this time has shifted to right upper quadrant. We were thinking about low-grade pancreatitis when I saw her initially but this time I feel it is more likely biliary She has a positive family history of gallbladder disease and has a borderline dilated common bile duct. R/o microlithiasis or chronic biliary dysfunction such as  acalculous cholecystitis, or Sphincter of Oddi dysfunction., which needs to be evaluated. We will go ahead with HIDA scan with CCK and repeat her amylase, lipase liver function tests and CA 19-9. She's quite concerned  about possibility of pancreatic cancer. Depending on the HIDA scan we may want to do an MRCP. She will continue on her AcipHex and a strict low-fat diet.Levsin SL.125 mg has not been effective. She does not want anything for pain.   12/12/2012 Lina Sar

## 2012-12-13 ENCOUNTER — Encounter (HOSPITAL_COMMUNITY)
Admission: RE | Admit: 2012-12-13 | Discharge: 2012-12-13 | Disposition: A | Discharge status: Home / Self Care | Payer: 59 | Source: Ambulatory Visit | Attending: Internal Medicine | Admitting: Internal Medicine

## 2012-12-13 DIAGNOSIS — R932 Abnormal findings on diagnostic imaging of liver and biliary tract: Secondary | ICD-10-CM | POA: Insufficient documentation

## 2012-12-13 DIAGNOSIS — R1011 Right upper quadrant pain: Secondary | ICD-10-CM | POA: Insufficient documentation

## 2012-12-13 MED ORDER — SINCALIDE 5 MCG IJ SOLR
0.0200 ug/kg | Freq: Once | INTRAMUSCULAR | Status: AC
  Administered 2012-12-13: 1.4 ug via INTRAVENOUS

## 2012-12-13 MED ORDER — TECHNETIUM TC 99M MEBROFENIN IV KIT
5.0000 | PACK | Freq: Once | INTRAVENOUS | Status: AC | PRN
  Administered 2012-12-13: 5 via INTRAVENOUS

## 2012-12-14 ENCOUNTER — Encounter (HOSPITAL_COMMUNITY): Payer: 59

## 2013-01-03 ENCOUNTER — Ambulatory Visit: Payer: 59 | Admitting: Internal Medicine

## 2013-01-03 ENCOUNTER — Telehealth: Payer: Self-pay | Admitting: Internal Medicine

## 2013-01-03 NOTE — Telephone Encounter (Signed)
Rene Kocher, could you, please, schedule Ms Skalsky for MRCP " suspected low grade pancreatitis,  Normal HIDA with CCK, abdominal pain ").Pancreatic protocol if there is one.

## 2013-01-03 NOTE — Telephone Encounter (Signed)
Patient states she is still having stomach issues. Yesterday, she felt terrible. Today is not so bad. She has decided she would like to have the MRI. Please, advise.

## 2013-01-04 ENCOUNTER — Other Ambulatory Visit: Payer: Self-pay | Admitting: *Deleted

## 2013-01-04 DIAGNOSIS — R109 Unspecified abdominal pain: Secondary | ICD-10-CM

## 2013-01-04 NOTE — Telephone Encounter (Signed)
Scheduled MRCP with pancreatic protocol at @[facility]  radiology on 01/05/13 at 4:45/5:00 PM. NPO 4 hours prior.Victorino Dike) Patient aware.

## 2013-01-05 ENCOUNTER — Other Ambulatory Visit: Payer: Self-pay | Admitting: Internal Medicine

## 2013-01-05 ENCOUNTER — Ambulatory Visit (HOSPITAL_COMMUNITY)
Admission: RE | Admit: 2013-01-05 | Discharge: 2013-01-05 | Disposition: A | Discharge status: Home / Self Care | Payer: 59 | Source: Ambulatory Visit | Attending: Internal Medicine | Admitting: Internal Medicine

## 2013-01-05 DIAGNOSIS — R109 Unspecified abdominal pain: Secondary | ICD-10-CM

## 2013-01-05 DIAGNOSIS — R1013 Epigastric pain: Secondary | ICD-10-CM | POA: Insufficient documentation

## 2013-01-05 DIAGNOSIS — Z90710 Acquired absence of both cervix and uterus: Secondary | ICD-10-CM | POA: Insufficient documentation

## 2013-01-05 LAB — CREATININE, SERUM: Creatinine, Ser: 0.88 mg/dL (ref 0.50–1.10)

## 2013-01-05 MED ORDER — GADOBENATE DIMEGLUMINE 529 MG/ML IV SOLN
13.0000 mL | Freq: Once | INTRAVENOUS | Status: AC | PRN
  Administered 2013-01-05: 13 mL via INTRAVENOUS

## 2013-01-09 ENCOUNTER — Telehealth: Payer: Self-pay | Admitting: *Deleted

## 2013-01-09 NOTE — Telephone Encounter (Signed)
Per Dr. Juanda Chance, MRI normal. Dr. Juanda Chance will call and discuss with patient. Left a message for patient to call me.

## 2013-01-09 NOTE — Telephone Encounter (Signed)
Patient given results and was told Dr. Juanda Chance will call her.

## 2013-02-05 ENCOUNTER — Ambulatory Visit
Admission: RE | Admit: 2013-02-05 | Discharge: 2013-02-05 | Disposition: A | Discharge status: Home / Self Care | Payer: 59 | Source: Ambulatory Visit | Attending: Internal Medicine | Admitting: Internal Medicine

## 2013-02-05 DIAGNOSIS — Z9889 Other specified postprocedural states: Secondary | ICD-10-CM

## 2013-02-05 DIAGNOSIS — Z1231 Encounter for screening mammogram for malignant neoplasm of breast: Secondary | ICD-10-CM

## 2013-02-15 ENCOUNTER — Ambulatory Visit: Payer: 59

## 2013-04-17 ENCOUNTER — Ambulatory Visit
Admission: RE | Admit: 2013-04-17 | Discharge: 2013-04-17 | Disposition: A | Discharge status: Home / Self Care | Payer: 59 | Source: Ambulatory Visit | Attending: Internal Medicine | Admitting: Internal Medicine

## 2013-04-17 ENCOUNTER — Other Ambulatory Visit: Payer: Self-pay | Admitting: Internal Medicine

## 2013-04-17 DIAGNOSIS — J329 Chronic sinusitis, unspecified: Secondary | ICD-10-CM

## 2013-04-23 ENCOUNTER — Other Ambulatory Visit (HOSPITAL_COMMUNITY): Payer: Self-pay | Admitting: Internal Medicine

## 2013-04-23 DIAGNOSIS — R519 Headache, unspecified: Secondary | ICD-10-CM

## 2013-04-24 ENCOUNTER — Ambulatory Visit (HOSPITAL_COMMUNITY)
Admission: RE | Admit: 2013-04-24 | Discharge: 2013-04-24 | Disposition: A | Discharge status: Home / Self Care | Payer: 59 | Source: Ambulatory Visit | Attending: Internal Medicine | Admitting: Internal Medicine

## 2013-04-24 DIAGNOSIS — R51 Headache: Secondary | ICD-10-CM | POA: Insufficient documentation

## 2013-04-24 LAB — CREATININE, SERUM
GFR calc Af Amer: >90 mL/min (ref >90)
GFR calc non Af Amer: 86 mL/min — ABNORMAL LOW (ref >90)

## 2013-04-24 MED ORDER — GADOBENATE DIMEGLUMINE 529 MG/ML IV SOLN
15.0000 mL | Freq: Once | INTRAVENOUS | Status: AC | PRN
  Administered 2013-04-24: 14 mL via INTRAVENOUS

## 2013-04-26 ENCOUNTER — Ambulatory Visit: Payer: 59 | Attending: Orthopedic Surgery

## 2013-04-26 DIAGNOSIS — M25559 Pain in unspecified hip: Secondary | ICD-10-CM | POA: Insufficient documentation

## 2013-04-26 DIAGNOSIS — M25659 Stiffness of unspecified hip, not elsewhere classified: Secondary | ICD-10-CM | POA: Insufficient documentation

## 2013-04-26 DIAGNOSIS — R5381 Other malaise: Secondary | ICD-10-CM | POA: Insufficient documentation

## 2013-04-26 DIAGNOSIS — IMO0001 Reserved for inherently not codable concepts without codable children: Secondary | ICD-10-CM | POA: Insufficient documentation

## 2013-05-02 ENCOUNTER — Ambulatory Visit: Payer: 59

## 2013-05-04 ENCOUNTER — Ambulatory Visit: Payer: 59 | Admitting: Physical Therapy

## 2013-05-08 ENCOUNTER — Ambulatory Visit: Payer: 59

## 2013-05-16 ENCOUNTER — Other Ambulatory Visit: Payer: Self-pay

## 2013-05-16 ENCOUNTER — Ambulatory Visit: Payer: 59 | Admitting: Physical Therapy

## 2013-05-17 ENCOUNTER — Telehealth: Payer: Self-pay | Admitting: *Deleted

## 2013-05-17 DIAGNOSIS — D1771 Benign lipomatous neoplasm of kidney: Secondary | ICD-10-CM

## 2013-05-17 NOTE — Telephone Encounter (Signed)
Scheduled [place] of abdomen at [facility] radiology([place]) on 06/15/13 at 8:30 AM. NPO after midnight. Spoke with patient and she wants to reschedule to late September. She will call and do this.

## 2013-05-17 NOTE — Telephone Encounter (Signed)
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 Juanda Chance,      I had a note that this patient needed f/u 6 month ultrasound abd small right renal lesion. Looks like she had more imaging after this. Does she need this [place]?      Stacey Short       ------

## 2013-05-18 ENCOUNTER — Ambulatory Visit: Payer: 59 | Admitting: Physical Therapy

## 2013-05-22 ENCOUNTER — Ambulatory Visit: Payer: 59 | Admitting: Physical Therapy

## 2013-05-23 ENCOUNTER — Encounter: Payer: 59 | Admitting: Physical Therapy

## 2013-05-24 ENCOUNTER — Ambulatory Visit: Payer: 59

## 2013-05-25 ENCOUNTER — Ambulatory Visit: Payer: 59 | Attending: Orthopedic Surgery | Admitting: Physical Therapy

## 2013-05-25 DIAGNOSIS — M25559 Pain in unspecified hip: Secondary | ICD-10-CM | POA: Insufficient documentation

## 2013-05-25 DIAGNOSIS — M25659 Stiffness of unspecified hip, not elsewhere classified: Secondary | ICD-10-CM | POA: Insufficient documentation

## 2013-05-25 DIAGNOSIS — IMO0001 Reserved for inherently not codable concepts without codable children: Secondary | ICD-10-CM | POA: Insufficient documentation

## 2013-05-25 DIAGNOSIS — R5381 Other malaise: Secondary | ICD-10-CM | POA: Insufficient documentation

## 2013-05-30 ENCOUNTER — Ambulatory Visit: Payer: 59 | Admitting: Physical Therapy

## 2013-06-01 ENCOUNTER — Encounter: Payer: 59 | Admitting: Physical Therapy

## 2013-06-06 ENCOUNTER — Other Ambulatory Visit (HOSPITAL_COMMUNITY): Payer: Self-pay | Admitting: Orthopedic Surgery

## 2013-06-06 DIAGNOSIS — M25551 Pain in right hip: Secondary | ICD-10-CM

## 2013-06-08 ENCOUNTER — Ambulatory Visit (HOSPITAL_COMMUNITY)
Admission: RE | Admit: 2013-06-08 | Discharge: 2013-06-08 | Disposition: A | Discharge status: Home / Self Care | Payer: 59 | Source: Ambulatory Visit | Attending: Orthopedic Surgery | Admitting: Orthopedic Surgery

## 2013-06-08 DIAGNOSIS — M79609 Pain in unspecified limb: Secondary | ICD-10-CM | POA: Insufficient documentation

## 2013-06-08 DIAGNOSIS — IMO0001 Reserved for inherently not codable concepts without codable children: Secondary | ICD-10-CM | POA: Insufficient documentation

## 2013-06-08 DIAGNOSIS — M25551 Pain in right hip: Secondary | ICD-10-CM

## 2013-06-08 DIAGNOSIS — M6688 Spontaneous rupture of other tendons, other: Secondary | ICD-10-CM | POA: Insufficient documentation

## 2013-06-15 ENCOUNTER — Ambulatory Visit (HOSPITAL_COMMUNITY): Payer: 59

## 2013-07-05 ENCOUNTER — Ambulatory Visit (HOSPITAL_COMMUNITY)
Admission: RE | Admit: 2013-07-05 | Discharge: 2013-07-05 | Disposition: A | Discharge status: Home / Self Care | Payer: 59 | Source: Ambulatory Visit | Attending: Internal Medicine | Admitting: Internal Medicine

## 2013-07-05 ENCOUNTER — Other Ambulatory Visit: Payer: Self-pay | Admitting: Internal Medicine

## 2013-07-05 DIAGNOSIS — D1771 Benign lipomatous neoplasm of kidney: Secondary | ICD-10-CM

## 2013-07-05 DIAGNOSIS — D175 Benign lipomatous neoplasm of intra-abdominal organs: Secondary | ICD-10-CM | POA: Insufficient documentation

## 2013-08-16 ENCOUNTER — Other Ambulatory Visit: Payer: Self-pay

## 2013-09-11 ENCOUNTER — Other Ambulatory Visit: Payer: Self-pay | Admitting: Orthopedic Surgery

## 2013-09-11 NOTE — Progress Notes (Signed)
Preoperative surgical orders have been place into the [facility] hospital system for Stacey Short on 09/11/2013, 9:52 AM  by Patrica Duel for surgery on 09-26-2013.  Preop Hip orders including Experel Injecion, IV Tylenol, and IV Decadron as long as there are no contraindications to the above medications. Avel Peace, PA-C

## 2013-09-20 ENCOUNTER — Encounter (HOSPITAL_COMMUNITY): Payer: Self-pay | Admitting: Pharmacy Technician

## 2013-09-21 NOTE — Patient Instructions (Addendum)
[address] [place]  09/21/2013   Your procedure is scheduled on:  09/26/13 [facility]  Report to [facility] at   1030    AM.  Call this number if you have problems the morning of surgery: [phone]       Remember:   Do not eat food :After Midnight. Tuesday NIGHT--- MAY HAVE CLEAR LIQUIDS Wednesday MORNING UNTIL 0730am-  THEN NOTHING BY MOUTH   Take these medicines the morning of surgery with A SIP OF WATER: ACIPHEX                                     May take ALPRAZOLAM IF NEEDED   .  Contacts, dentures or partial plates can not be worn to surgery  Leave suitcase in the car. After surgery it may be brought to your room.  For patients admitted to the hospital, checkout time is 11:00 AM day of  discharge.             SPECIAL INSTRUCTIONS- SEE [facility] PREPARING FOR SURGERY INSTRUCTION SHEET-     DO NOT WEAR JEWELRY, LOTIONS, POWDERS, OR PERFUMES.  WOMEN-- DO NOT SHAVE LEGS OR UNDERARMS FOR 12 HOURS BEFORE SHOWERS. MEN MAY SHAVE FACE.  Patients discharged the day of surgery will not be allowed to drive home. IF going home the day of surgery, you must have a driver and someone to stay with you for the first 24 hours  Name and phone number of your driver:   Dr Neva Seat                                                                    PLEASE READ INCENTIVE SPIROMETRY INSTRUCTIONS                                                     Mrytle Bento  PST [phone]  [phone]                 FAILURE TO FOLLOW THESE INSTRUCTIONS MAY RESULT IN  CANCELLATION   OF YOUR SURGERY                                                  Patient Signature _____________________________

## 2013-09-24 ENCOUNTER — Encounter (HOSPITAL_COMMUNITY): Payer: Self-pay

## 2013-09-24 ENCOUNTER — Encounter (HOSPITAL_COMMUNITY)
Admission: RE | Admit: 2013-09-24 | Discharge: 2013-09-24 | Disposition: A | Discharge status: Home / Self Care | Payer: 59 | Source: Ambulatory Visit | Attending: Orthopedic Surgery | Admitting: Orthopedic Surgery

## 2013-09-24 DIAGNOSIS — Z01812 Encounter for preprocedural laboratory examination: Secondary | ICD-10-CM | POA: Insufficient documentation

## 2013-09-24 LAB — CBC
HCT: 40.7 % (ref 36.0–46.0)
Hemoglobin: 13.8 g/dL (ref 12.0–15.0)
MCH: 32.2 pg (ref 26.0–34.0)
MCHC: 33.9 g/dL (ref 30.0–36.0)
MCV: 94.9 fL (ref 78.0–100.0)
RBC: 4.29 MIL/uL (ref 3.87–5.11)
WBC: 4.8 10*3/uL (ref 4.0–10.5)

## 2013-09-25 DIAGNOSIS — M7070 Other bursitis of hip, unspecified hip: Secondary | ICD-10-CM | POA: Diagnosis present

## 2013-09-25 NOTE — H&P (Signed)
  CC- Stacey Short is a 67 y.o. female who presents with right hip pain  Hip Pain: Patient complains of right hip pain. Onset of the symptoms was several months ago. Inciting event: none. Current symptoms include is aggravated by walking and is worse with lying on right side. Associated symptoms: none. Aggravating symptoms: lateral movements, pivoting, rising after sitting and walking. Patient's course of pain: gradually worsening. Patient has had no prior hip problems  Evaluation to date: plain films, which were normal. MRI showing focal gluteal tendon degeneration Treatment to date: physical therapy, which has been ineffective and cortisone injection which was ineffective.  Past Medical History  Diagnosis Date  . COLONIC POLYPS, HYPERPLASTIC, HX OF   . Hyperlipidemia   . Unspecified hemorrhoids without mention of complication   . H/O gastritis     egd 2009  ,h.pylori negative    Past Surgical History  Procedure Laterality Date  . Partial hysterectomy    . Thumb joint repair    . Breast reduction surgery    . Tummy tuck      Prior to Admission medications   Medication Sig Start Date End Date Taking? Authorizing Provider  ALPRAZolam Prudy Feeler) 1 MG tablet Take 1 tablet (1 mg total) by mouth at bedtime as needed for sleep. 09/01/12  Yes Jonita Albee, MD  Multiple Vitamins-Minerals (MULTIVITAMIN PO) Take 1 tablet by mouth daily.    Yes Historical Provider, MD  Omega-3 Fatty Acids (FISH OIL PO) Take by mouth.   Yes Historical Provider, MD  RABEprazole (ACIPHEX) 20 MG tablet Take 20 mg by mouth daily as needed (ulcer). 11/22/12  Yes Hart Carwin, MD  rosuvastatin (CRESTOR) 10 MG tablet Take 5-10 mg by mouth daily with breakfast. Take 5 mg every other day, and 10 mg other days.   Yes Historical Provider, MD  VITAMIN D, CHOLECALCIFEROL, PO Take 1 tablet by mouth daily.   Yes Historical Provider, MD  zolpidem (AMBIEN) 10 MG tablet Take 5 mg by mouth at bedtime as needed.   Yes Historical  Provider, MD    Physical Examination: General appearance - alert, well appearing, and in no distress Mental status - alert, oriented to person, place, and time Chest - clear to auscultation, no wheezes, rales or rhonchi, symmetric air entry Heart - normal rate, regular rhythm, normal S1, S2, no murmurs, rubs, clicks or gallops Abdomen - soft, nontender, nondistended, no masses or organomegaly Neurological - alert, oriented, normal speech, no focal findings or movement disorder noted  A right hip exam was performed. GENERAL: no acute distress SKIN: intact TENDERNESS: maximal at greater trochanter ROM: normal STRENGTH: normal GAIT: normal  ASSESSMENT:Right hip intractable bursitis with gluteal tendon tear  Plan: Right hip bursectomy with tendon repair. Discussed procedure, risks potential complications and rehab course in detail and she elects to proceed  Gus Rankin. Stonewall Doss, MD    09/25/2013, 9:20 PM

## 2013-09-26 ENCOUNTER — Ambulatory Visit (HOSPITAL_COMMUNITY)
Admission: RE | Admit: 2013-09-26 | Discharge: 2013-09-26 | Disposition: A | Discharge status: Home / Self Care | Payer: 59 | Source: Ambulatory Visit | Attending: Orthopedic Surgery | Admitting: Orthopedic Surgery

## 2013-09-26 ENCOUNTER — Encounter (HOSPITAL_COMMUNITY): Payer: Self-pay | Admitting: *Deleted

## 2013-09-26 ENCOUNTER — Encounter (HOSPITAL_COMMUNITY)
Admission: RE | Disposition: A | Discharge status: Home / Self Care | Payer: Self-pay | Source: Ambulatory Visit | Attending: Orthopedic Surgery

## 2013-09-26 ENCOUNTER — Encounter (HOSPITAL_COMMUNITY): Payer: 59 | Admitting: Anesthesiology

## 2013-09-26 ENCOUNTER — Ambulatory Visit (HOSPITAL_COMMUNITY): Payer: 59 | Admitting: Anesthesiology

## 2013-09-26 DIAGNOSIS — Z860100 Personal history of colon polyps, unspecified: Secondary | ICD-10-CM | POA: Insufficient documentation

## 2013-09-26 DIAGNOSIS — E785 Hyperlipidemia, unspecified: Secondary | ICD-10-CM | POA: Insufficient documentation

## 2013-09-26 DIAGNOSIS — M7070 Other bursitis of hip, unspecified hip: Secondary | ICD-10-CM | POA: Diagnosis present

## 2013-09-26 DIAGNOSIS — Z90710 Acquired absence of both cervix and uterus: Secondary | ICD-10-CM | POA: Insufficient documentation

## 2013-09-26 DIAGNOSIS — M7071 Other bursitis of hip, right hip: Secondary | ICD-10-CM

## 2013-09-26 DIAGNOSIS — Z87891 Personal history of nicotine dependence: Secondary | ICD-10-CM | POA: Insufficient documentation

## 2013-09-26 DIAGNOSIS — M76899 Other specified enthesopathies of unspecified lower limb, excluding foot: Secondary | ICD-10-CM | POA: Insufficient documentation

## 2013-09-26 DIAGNOSIS — Z79899 Other long term (current) drug therapy: Secondary | ICD-10-CM | POA: Insufficient documentation

## 2013-09-26 HISTORY — PX: EXCISION/RELEASE BURSA HIP: SHX5014

## 2013-09-26 SURGERY — RELEASE, BURSA, TROCHANTERIC
Anesthesia: General | Site: Hip | Laterality: Right

## 2013-09-26 MED ORDER — FENTANYL CITRATE 0.05 MG/ML IJ SOLN
INTRAMUSCULAR | Status: AC
  Filled 2013-09-26: qty 2

## 2013-09-26 MED ORDER — ONDANSETRON HCL 4 MG/2ML IJ SOLN
INTRAMUSCULAR | Status: AC
  Filled 2013-09-26: qty 2

## 2013-09-26 MED ORDER — HYDROMORPHONE HCL PF 1 MG/ML IJ SOLN: 0.2500 mg | INTRAMUSCULAR | Status: DC | PRN

## 2013-09-26 MED ORDER — CEFAZOLIN SODIUM-DEXTROSE 2-3 GM-% IV SOLR
INTRAVENOUS | Status: AC
  Filled 2013-09-26: qty 50

## 2013-09-26 MED ORDER — SUCCINYLCHOLINE CHLORIDE 20 MG/ML IJ SOLN
INTRAMUSCULAR | Status: DC | PRN
  Administered 2013-09-26: 100 mg via INTRAVENOUS

## 2013-09-26 MED ORDER — ROCURONIUM BROMIDE 100 MG/10ML IV SOLN
INTRAVENOUS | Status: AC
  Filled 2013-09-26: qty 1

## 2013-09-26 MED ORDER — DEXAMETHASONE SODIUM PHOSPHATE 10 MG/ML IJ SOLN
INTRAMUSCULAR | Status: AC
  Filled 2013-09-26: qty 1

## 2013-09-26 MED ORDER — SODIUM CHLORIDE 0.9 % IJ SOLN
INTRAMUSCULAR | Status: AC
  Filled 2013-09-26: qty 50

## 2013-09-26 MED ORDER — LACTATED RINGERS IV SOLN: INTRAVENOUS | Status: DC

## 2013-09-26 MED ORDER — DEXAMETHASONE SODIUM PHOSPHATE 10 MG/ML IJ SOLN
INTRAMUSCULAR | Status: DC | PRN
  Administered 2013-09-26: 10 mg via INTRAVENOUS

## 2013-09-26 MED ORDER — LACTATED RINGERS IV SOLN
INTRAVENOUS | Status: DC
  Administered 2013-09-26: 1000 mL via INTRAVENOUS
  Administered 2013-09-26: 15:00:00 via INTRAVENOUS

## 2013-09-26 MED ORDER — ONDANSETRON HCL 4 MG/2ML IJ SOLN
INTRAMUSCULAR | Status: DC | PRN
  Administered 2013-09-26: 4 mg via INTRAVENOUS

## 2013-09-26 MED ORDER — HYDROMORPHONE HCL 2 MG PO TABS: 2.0000 mg | ORAL_TABLET | ORAL | Status: DC | PRN

## 2013-09-26 MED ORDER — LIDOCAINE HCL (CARDIAC) 20 MG/ML IV SOLN
INTRAVENOUS | Status: DC | PRN
  Administered 2013-09-26: 100 mg via INTRAVENOUS

## 2013-09-26 MED ORDER — BUPIVACAINE LIPOSOME 1.3 % IJ SUSP
20.0000 mL | Freq: Once | INTRAMUSCULAR | Status: DC
  Filled 2013-09-26: qty 20

## 2013-09-26 MED ORDER — PROPOFOL 10 MG/ML IV BOLUS
INTRAVENOUS | Status: AC
  Filled 2013-09-26: qty 20

## 2013-09-26 MED ORDER — SUCCINYLCHOLINE CHLORIDE 20 MG/ML IJ SOLN
INTRAMUSCULAR | Status: AC
  Filled 2013-09-26: qty 1

## 2013-09-26 MED ORDER — SODIUM CHLORIDE 0.9 % IV SOLN: INTRAVENOUS | Status: DC

## 2013-09-26 MED ORDER — METHOCARBAMOL 500 MG PO TABS: 500.0000 mg | ORAL_TABLET | Freq: Four times a day (QID) | ORAL | Status: DC

## 2013-09-26 MED ORDER — MIDAZOLAM HCL 5 MG/5ML IJ SOLN
INTRAMUSCULAR | Status: DC | PRN
  Administered 2013-09-26: 2 mg via INTRAVENOUS

## 2013-09-26 MED ORDER — MIDAZOLAM HCL 2 MG/2ML IJ SOLN
INTRAMUSCULAR | Status: AC
  Filled 2013-09-26: qty 2

## 2013-09-26 MED ORDER — LIDOCAINE HCL (CARDIAC) 20 MG/ML IV SOLN
INTRAVENOUS | Status: AC
  Filled 2013-09-26: qty 5

## 2013-09-26 MED ORDER — FENTANYL CITRATE 0.05 MG/ML IJ SOLN
INTRAMUSCULAR | Status: DC | PRN
  Administered 2013-09-26: 100 ug via INTRAVENOUS
  Administered 2013-09-26: 50 ug via INTRAVENOUS

## 2013-09-26 MED ORDER — BUPIVACAINE HCL 0.25 % IJ SOLN
INTRAMUSCULAR | Status: DC | PRN
  Administered 2013-09-26: 20 mL

## 2013-09-26 MED ORDER — BUPIVACAINE HCL (PF) 0.25 % IJ SOLN
INTRAMUSCULAR | Status: AC
Start: 2013-09-26 — End: 2013-09-26
  Filled 2013-09-26: qty 30

## 2013-09-26 MED ORDER — DEXAMETHASONE SODIUM PHOSPHATE 10 MG/ML IJ SOLN: 10.0000 mg | Freq: Once | INTRAMUSCULAR | Status: DC

## 2013-09-26 MED ORDER — PROPOFOL 10 MG/ML IV BOLUS
INTRAVENOUS | Status: DC | PRN
  Administered 2013-09-26: 170 mg via INTRAVENOUS

## 2013-09-26 MED ORDER — PROMETHAZINE HCL 25 MG/ML IJ SOLN: 6.2500 mg | INTRAMUSCULAR | Status: DC | PRN

## 2013-09-26 MED ORDER — SODIUM CHLORIDE 0.9 % IJ SOLN
INTRAMUSCULAR | Status: DC | PRN
  Administered 2013-09-26: 15:00:00

## 2013-09-26 MED ORDER — 0.9 % SODIUM CHLORIDE (POUR BTL) OPTIME
TOPICAL | Status: DC | PRN
  Administered 2013-09-26: 1000 mL

## 2013-09-26 MED ORDER — CEFAZOLIN SODIUM-DEXTROSE 2-3 GM-% IV SOLR
2.0000 g | INTRAVENOUS | Status: AC
  Administered 2013-09-26: 2 g via INTRAVENOUS

## 2013-09-26 MED ORDER — ACETAMINOPHEN 10 MG/ML IV SOLN
1000.0000 mg | Freq: Once | INTRAVENOUS | Status: AC
  Administered 2013-09-26: 1000 mg via INTRAVENOUS
  Filled 2013-09-26: qty 100

## 2013-09-26 SURGICAL SUPPLY — 43 items
BAG SPEC THK2 15X12 ZIP CLS (MISCELLANEOUS) ×1
BAG ZIPLOCK 12X15 (MISCELLANEOUS) ×2 IMPLANT
BIT DRILL 2.8 QUICK RELEASE (BIT) IMPLANT
BLADE EXTENDED COATED 6.5IN (ELECTRODE) IMPLANT
DRAPE INCISE IOBAN 66X45 STRL (DRAPES) ×2 IMPLANT
DRAPE ORTHO SPLIT 77X108 STRL (DRAPES) ×4
DRAPE POUCH INSTRU U-SHP 10X18 (DRAPES) ×2 IMPLANT
DRAPE SURG ORHT 6 SPLT 77X108 (DRAPES) ×2 IMPLANT
DRAPE U-SHAPE 47X51 STRL (DRAPES) ×2 IMPLANT
DRILL 2.8 QUICK RELEASE (BIT)
DRSG ADAPTIC 3X8 NADH LF (GAUZE/BANDAGES/DRESSINGS) ×2 IMPLANT
DRSG MEPILEX BORDER 4X4 (GAUZE/BANDAGES/DRESSINGS) IMPLANT
DRSG MEPILEX BORDER 4X8 (GAUZE/BANDAGES/DRESSINGS) ×2 IMPLANT
DURAPREP 26ML APPLICATOR (WOUND CARE) ×2 IMPLANT
ELECT REM PT RETURN 9FT ADLT (ELECTROSURGICAL) ×2
ELECTRODE REM PT RTRN 9FT ADLT (ELECTROSURGICAL) ×1 IMPLANT
GLOVE BIO SURGEON STRL SZ7.5 (GLOVE) ×2 IMPLANT
GLOVE BIO SURGEON STRL SZ8 (GLOVE) ×4 IMPLANT
GLOVE BIOGEL PI IND STRL 8 (GLOVE) ×3 IMPLANT
GLOVE BIOGEL PI INDICATOR 8 (GLOVE) ×3
GOWN PREVENTION PLUS LG XLONG (DISPOSABLE) ×2 IMPLANT
GOWN STRL REIN XL XLG (GOWN DISPOSABLE) ×2 IMPLANT
IV SET HUBERPLUS 22X1 SAFETY (NEEDLE) IMPLANT
KIT BASIN OR (CUSTOM PROCEDURE TRAY) ×2 IMPLANT
MANIFOLD NEPTUNE II (INSTRUMENTS) ×2 IMPLANT
NDL SAFETY ECLIPSE 18X1.5 (NEEDLE) ×1 IMPLANT
NEEDLE HYPO 18GX1.5 SHARP (NEEDLE) ×2
NS IRRIG 1000ML POUR BTL (IV SOLUTION) ×2 IMPLANT
PACK TOTAL JOINT (CUSTOM PROCEDURE TRAY) ×2 IMPLANT
PASSER SUT SWANSON 36MM LOOP (INSTRUMENTS) IMPLANT
POSITIONER SURGICAL ARM (MISCELLANEOUS) ×2 IMPLANT
SPONGE GAUZE 4X4 12PLY (GAUZE/BANDAGES/DRESSINGS) ×2 IMPLANT
STAPLER VISISTAT 35W (STAPLE) IMPLANT
STRIP CLOSURE SKIN 1/2X4 (GAUZE/BANDAGES/DRESSINGS) ×2 IMPLANT
SUT ETHIBOND NAB CT1 #1 30IN (SUTURE) ×4 IMPLANT
SUT MNCRL AB 4-0 PS2 18 (SUTURE) ×2 IMPLANT
SUT VIC AB 1 CT1 27 (SUTURE) ×6
SUT VIC AB 1 CT1 27XBRD ANTBC (SUTURE) ×3 IMPLANT
SUT VIC AB 2-0 CT1 27 (SUTURE) ×4
SUT VIC AB 2-0 CT1 TAPERPNT 27 (SUTURE) ×2 IMPLANT
SYR 30ML LL (SYRINGE) ×2 IMPLANT
TOWEL OR 17X26 10 PK STRL BLUE (TOWEL DISPOSABLE) ×4 IMPLANT
WATER STERILE IRR 1500ML POUR (IV SOLUTION) ×2 IMPLANT

## 2013-09-26 NOTE — Anesthesia Preprocedure Evaluation (Signed)
Anesthesia Evaluation  Patient identified by MRN, date of birth, ID band Patient awake    Reviewed: Allergy & Precautions, H&P , NPO status , Patient's Chart, lab work & pertinent test results  Airway Mallampati: II TM Distance: >3 FB Neck ROM: Full    Dental  (+) Teeth Intact, Caps and Dental Advisory Given   Pulmonary neg pulmonary ROS, former smoker,  breath sounds clear to auscultation  Pulmonary exam normal       Cardiovascular negative cardio ROS  Rhythm:Regular Rate:Normal     Neuro/Psych negative neurological ROS  negative psych ROS   GI/Hepatic negative GI ROS, Neg liver ROS,   Endo/Other  negative endocrine ROS  Renal/GU negative Renal ROS  negative genitourinary   Musculoskeletal negative musculoskeletal ROS (+)   Abdominal   Peds  Hematology negative hematology ROS (+)   Anesthesia Other Findings   Reproductive/Obstetrics                           Anesthesia Physical Anesthesia Plan  ASA: I  Anesthesia Plan: General   Post-op Pain Management:    Induction: Intravenous  Airway Management Planned: Oral ETT  Additional Equipment:   Intra-op Plan:   Post-operative Plan: Extubation in OR  Informed Consent: I have reviewed the patients History and Physical, chart, labs and discussed the procedure including the risks, benefits and alternatives for the proposed anesthesia with the patient or authorized representative who has indicated his/her understanding and acceptance.   Dental advisory given  Plan Discussed with: CRNA  Anesthesia Plan Comments:         Anesthesia Quick Evaluation

## 2013-09-26 NOTE — Transfer of Care (Signed)
Immediate Anesthesia Transfer of Care Note  Patient: Stacey Short  Procedure(s) Performed: Procedure(s) (LRB): RIGHT GLUTEAL BURSECTOMY, GLUTEAL TENDON DEBRIDEMENT  (Right)  Patient Location: PACU  Anesthesia Type: General  Level of Consciousness: sedated, patient cooperative and responds to stimulation  Airway & Oxygen Therapy: Patient Spontanous Breathing and Patient connected to face mask oxgen  Post-op Assessment: Report given to PACU RN and Post -op Vital signs reviewed and stable  Post vital signs: Reviewed and stable  Complications: No apparent anesthesia complications

## 2013-09-26 NOTE — Brief Op Note (Signed)
09/26/2013  2:45 PM  PATIENT:  Stacey Short  67 y.o. female  PRE-OPERATIVE DIAGNOSIS:  RIGHT HIP BURSITIS, possible torn gluteal tendon  POST-OPERATIVE DIAGNOSIS:  right hip bursitis, gluteal tendonosis  PROCEDURE:  Procedure(s): RIGHT GLUTEAL BURSECTOMY, GLUTEAL TENDON DEBRIDEMENT  (Right)  SURGEON:  Surgeon(s) and Role:    * Loanne Drilling, MD - Primary  PHYSICIAN ASSISTANT:   ASSISTANTS: Avel Peace, PA-C   ANESTHESIA:   general  EBL:     BLOOD ADMINISTERED:none  DRAINS: none   LOCAL MEDICATIONS USED:  OTHER Exparel  COUNTS:  YES  TOURNIQUET:  * No tourniquets in log *  DICTATION: .Other Dictation: Dictation Number [id number]  PLAN OF CARE: Discharge to home after PACU  PATIENT DISPOSITION:  PACU - hemodynamically stable.

## 2013-09-26 NOTE — Anesthesia Postprocedure Evaluation (Signed)
Anesthesia Post Note  Patient: Stacey Short  Procedure(s) Performed: Procedure(s) (LRB): RIGHT GLUTEAL BURSECTOMY, GLUTEAL TENDON DEBRIDEMENT  (Right)  Anesthesia type: General  Patient location: PACU  Post pain: Pain level controlled  Post assessment: Post-op Vital signs reviewed  Last Vitals:  Filed Vitals:   09/26/13 1655  BP: 115/53  Pulse: 70  Temp: 36.3 C  Resp: 18    Post vital signs: Reviewed  Level of consciousness: sedated  Complications: No apparent anesthesia complications

## 2013-09-26 NOTE — Interval H&P Note (Signed)
History and Physical Interval Note:  09/26/2013 1:47 PM  Stacey Short  has presented today for surgery, with the diagnosis of RIGHT HIP BURSITIS  The various methods of treatment have been discussed with the patient and family. After consideration of risks, benefits and other options for treatment, the patient has consented to  Procedure(s): RIGHT HIP BURSECTOMY WITH GLUTEAL TENDON REPAIR (Right) as a surgical intervention .  The patient's history has been reviewed, patient examined, no change in status, stable for surgery.  I have reviewed the patient's chart and labs.  Questions were answered to the patient's satisfaction.     Loanne Drilling

## 2013-09-27 ENCOUNTER — Encounter (HOSPITAL_COMMUNITY): Payer: Self-pay | Admitting: Orthopedic Surgery

## 2013-09-27 NOTE — Op Note (Signed)
Stacey Short, Stacey Short NO.:  [id number]  MEDICAL RECORD NO.:  [id number]  LOCATION:  [facility]                         FACILITY:  [facility]  PHYSICIAN:  Ollen Gross, M.D.    DATE OF BIRTH:  1946-01-20  DATE OF PROCEDURE:  09/26/2013 DATE OF DISCHARGE:                              OPERATIVE REPORT   PREOPERATIVE DIAGNOSIS:  Right hip intractable bursitis with possible gluteal tendon tear.  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  PROCEDURE:  Right hip trochanteric bursectomy with gluteal tendon debridement.  SURGEON:  Ollen Gross, M.D.  ASSISTANT:  Alexzandrew L. Perkins, P.A.C.  ANESTHESIA:  General.  ESTIMATED BLOOD LOSS:  Minimal.  DRAINS:  None.  COMPLICATIONS:  None.  CONDITION:  Stable to recovery.  BRIEF CLINICAL NOTE:  Stacey Short is a 67 year old female with a long history of significant pain along the lateral aspect of the right hip. She has had a trochanteric bursal injection with minimal benefit and has had physical therapy with minimal benefit.  She had a recent MRI scan showing significant bursitis and also showing tendinosis versus partial tear of her gluteus medius tendon.  She presents now for bursectomy possible tendon repair versus debridement due to intractable pain.  PROCEDURE IN DETAIL:  After successful administration of general anesthetic, the patient was placed in the left lateral decubitus position with the right side up and held with the hip positioner.  Right lower extremity was isolated from her perineum with plastic drapes and prepped and draped in the usual sterile fashion.  A lateral based incision was made over the hip approximately 4 inches in length centered at the tip of the greater trochanter.  The skin was cut with a 10 blade through subcutaneous tissue to the level of the fascia lata which was incised in line with the skin incision.  There was evidence of a  fluid- filled bursa.  First, the sciatic nerve was palpated and protected. Also palpated the piriformis which did not appear to be tight or appear to be compressing the sciatic nerve.  The bursa was subsequently removed with electrocautery, the large thickened bursa.  There was no obvious tear within the gluteus medius tendon.  Palpating at the anterior 3rd, there was an area where the tendon was appeared to be hypertrophied and with a soft area.  I made a longitudinal incision to the tendon in that area.  There was degenerative tissue within this part of the tendon. There was also a large calcium deposit on the edge of it.  I removed the degenerative tissue and then removed the calcium deposit.  We did not have to detach the tendon from the bone.  The rest of the tendons inspected.  No other abnormal areas encountered.  I was able to palpate through this longitudinal defect into the gluteus minimus and there was no abnormality there.  We then repaired that __________ tendon side to side with Ethibond suture.  This effectively got the tendon back to a normal configuration after the degenerative portion was removed.  The wound was then copiously irrigated with saline solution.  A meticulous hemostasis achieved.  I then injected the gluteus medius,  the fascia lata and the gluteus maximus and subcutaneous tissues with a combination of 20 mL of Exparel and 30 mL of saline.  An additional 20 mL of 0.25% Marcaine was injected into the same tissues.  I then closed the fascia lata with a running #1 Vicryl __________ this triangular portion overlying the tip of the greater trochanter so there would not be any rubbing there.  Subcu was then closed with #1 and 2-0 Vicryl and subcuticular running 4-0 Monocryl.  Incisions cleaned and dried and Steri-Strips and a bulky sterile dressing applied.  She was awakened and transported to recovery in stable condition.     Ollen Gross,  M.D.     FA/MEDQ  D:  09/26/2013  T:  09/27/2013  Job:  [id number]

## 2013-10-19 ENCOUNTER — Encounter: Payer: Self-pay | Admitting: Neurology

## 2013-10-19 ENCOUNTER — Ambulatory Visit (INDEPENDENT_AMBULATORY_CARE_PROVIDER_SITE_OTHER): Payer: Medicare HMO | Admitting: Neurology

## 2013-10-19 VITALS — BP 120/69 | HR 61 | Temp 97.4°F | Ht 66.0 in | Wt 149.0 lb

## 2013-10-19 DIAGNOSIS — G4733 Obstructive sleep apnea (adult) (pediatric): Secondary | ICD-10-CM

## 2013-10-19 NOTE — Progress Notes (Signed)
Subjective:    Patient ID: Stacey Short is a 68 y.o. female.  HPI  Star Age, MD, PhD [facility] [address] [place], [place] [address]  Dear Dr. Joylene Draft,  I saw your patient, Stacey Short, upon your kind request in my neurologic clinic today for initial consultation of her obstructive sleep apnea. The patient is unaccompanied today. As you know, Stacey Short is a very friendly 68 year old right-handed woman with an underlying medical history of gastritis, colonic polyps, and recently status post right hip trochanteric bursectomy with gluteal tendon debridement on 09/26/2013, who recently had a home sleep test, based on a history of mild snoring.  I reviewed the home sleep test results today and explained the findings to her: She had a home sleep test on 08/15/2013 and slept 181 minutes out of 421 minutes of testing time. Her estimated AHI was 21.2 per hour which includes central respiratory events. Her baseline oxygen saturation was 98%, her nadir was 85%. Time below 88% saturation was 2 minutes.  Her typical bedtime is reported to be around 10:30 to 11 PM and usual wake time is around 7:30 AM. Sleep onset typically occurs within 30 minutes. She reports feeling well rested upon awakening. She wakes up on an average 2 times in the middle of the night and has to go to the bathroom 1 time on a typical night. She usually wakes up right around 1 AM for the first time and takes a half of an Ambien at the time. She has been doing this for quite some time without any side effects reported. She denies any morning headaches.  She denies excessive daytime somnolence (EDS). The patient has not been taking a planned or any unplanned naps. She has been known to snore for the past few years. Snoring is reportedly mild, and not overtly associated with choking sounds but her husband has noted occasional witnessed apneas. The patient denies a sense of choking or  strangling feeling. There is no report of nighttime reflux, with no nighttime cough experienced. She drink 1-2 glasses of wine each night.  She denies any mood or memory related issues. She does not have a preferred sleep positions but since her right hip surgery she has not been able to sleep on her right side very much. She usually exercises regularly but has not picked up her exercise program since her recent hip surgery. She does not take any narcotic pain medication at this time.  Her Past Medical History Is Significant For: Past Medical History  Diagnosis Date  . COLONIC POLYPS, HYPERPLASTIC, HX OF   . Hyperlipidemia   . Unspecified hemorrhoids without mention of complication   . H/O gastritis     egd 2009  ,h.pylori negative    Her Past Surgical History Is Significant For: Past Surgical History  Procedure Laterality Date  . Partial hysterectomy    . Thumb joint repair    . Breast reduction surgery    . Tummy tuck    . Excision/release bursa hip Right 09/26/2013    Procedure: RIGHT GLUTEAL BURSECTOMY, GLUTEAL TENDON DEBRIDEMENT ;  Surgeon: Gearlean Alf, MD;  Location: [facility];  Service: Orthopedics;  Laterality: Right;    Her Family History Is Significant For: Family History  Problem Relation Age of Onset  . Kidney cancer Sister   . Colon cancer Neg Hx   . Lymphoma Father   . Diabetes Mother   . Diabetes Sister  Her Social History Is Significant For: History   Social History  . Marital Status: Married    Spouse Name: N/A    Number of Children: N/A  . Years of Education: N/A   Social History Main Topics  . Smoking status: Former Smoker    Quit date: 10/11/1982  . Smokeless tobacco: Never Used  . Alcohol Use: Yes     Comment: 2 glasses wine daily  . Drug Use: No  . Sexual Activity: None   Other Topics Concern  . None   Social History Narrative  . None    Her Allergies Are:  Allergies  Allergen Reactions  . Celecoxib     REACTION: swelling  .  Codeine     REACTION: nausea  . Ultram [Tramadol] Nausea And Vomiting  :   Her Current Medications Are:  Outpatient Encounter Prescriptions as of 10/19/2013  Medication Sig  . ALPRAZolam (XANAX) 1 MG tablet Take 1 tablet (1 mg total) by mouth at bedtime as needed for sleep.  Marland Kitchen estradiol (ESTRACE) 0.1 MG/GM vaginal cream Place 1 Applicatorful vaginally 2 (two) times a week.  . estradiol (VIVELLE-DOT) 0.025 MG/24HR Place 1 patch onto the skin 2 (two) times a week.  Marland Kitchen HYDROmorphone (DILAUDID) 2 MG tablet Take 1-2 tablets (2-4 mg total) by mouth every 4 (four) hours as needed for severe pain.  . methocarbamol (ROBAXIN) 500 MG tablet Take 1 tablet (500 mg total) by mouth 4 (four) times daily. As needed for muscle spasm  . Multiple Vitamins-Minerals (MULTIVITAMIN PO) Take 1 tablet by mouth daily.   . Omega-3 Fatty Acids (FISH OIL PO) Take by mouth.  . RABEprazole (ACIPHEX) 20 MG tablet Take 20 mg by mouth daily as needed (ulcer).  . rosuvastatin (CRESTOR) 10 MG tablet Take 5-10 mg by mouth daily with breakfast. Take 5 mg every other day, and 10 mg other days.  Marland Kitchen testosterone (ANDROGEL) 50 MG/5GM GEL 5 g 2 (two) times a week.  Marland Kitchen VITAMIN D, CHOLECALCIFEROL, PO Take 1 tablet by mouth daily.  Marland Kitchen zolpidem (AMBIEN) 10 MG tablet Take 5 mg by mouth at bedtime as needed.   Review of Systems:  Out of a complete 14 point review of systems, all are reviewed and negative with the exception of these symptoms as listed below:    Review of Systems  Constitutional: Negative.   HENT: Negative.   Eyes: Negative.   Respiratory: Negative.   Cardiovascular: Negative.   Gastrointestinal: Negative.   Endocrine: Negative.   Genitourinary: Negative.   Musculoskeletal: Negative.   Skin: Negative.   Allergic/Immunologic: Negative.   Neurological: Negative.   Hematological: Negative.   Psychiatric/Behavioral: Positive for sleep disturbance (insomnia).    Objective:  Neurologic Exam  Physical Exam Physical  Examination:   Filed Vitals:   10/19/13 1057  BP: 120/69  Pulse: 61  Temp: 97.4 F (36.3 C)    General Examination: The patient is a very pleasant 68 y.o. female in no acute distress. She appears well-developed and well-nourished and very well groomed. She is not obese.  HEENT: Normocephalic, atraumatic, pupils are equal, round and reactive to light and accommodation. Funduscopic exam is normal with sharp disc margins noted. Extraocular tracking is good without limitation to gaze excursion or nystagmus noted. Normal smooth pursuit is noted. Hearing is grossly intact. Tympanic membranes are clear bilaterally. Face is symmetric with normal facial animation and normal facial sensation. Speech is clear with no dysarthria noted. There is no hypophonia. There is no lip, neck/head, jaw  or voice tremor. Neck is supple with full range of passive and active motion. There are no carotid bruits on auscultation. Oropharynx exam reveals: mild mouth dryness, adequate dental hygiene and mild airway crowding, due to floppy soft palate. Mallampati is class II. Tongue protrudes centrally and palate elevates symmetrically. Tonsils are small in size. Neck size is 13.75 inches. She has a fairly significant overbite. She has no significant nasal mucosal swelling or septal deviation on nasal inspection.   Chest: Clear to auscultation without wheezing, rhonchi or crackles noted.  Heart: S1+S2+0, regular and normal without murmurs, rubs or gallops noted.   Abdomen: Soft, non-tender and non-distended with normal bowel sounds appreciated on auscultation.  Extremities: There is no pitting edema in the distal lower extremities bilaterally. Pedal pulses are intact.  Skin: Warm and dry without trophic changes noted. There are no varicose veins.  Musculoskeletal: exam reveals no obvious joint deformities, tenderness or joint swelling or erythema.   Neurologically:  Mental status: The patient is awake, alert and oriented  in all 4 spheres. Her memory, attention, language and knowledge are appropriate. There is no aphasia, agnosia, apraxia or anomia. Speech is clear with normal prosody and enunciation. Thought process is linear. Mood is congruent and affect is normal.  Cranial nerves are as described above under HEENT exam. In addition, shoulder shrug is normal with equal shoulder height noted. Motor exam: Normal bulk, strength and tone is noted. There is no drift, tremor or rebound. Romberg is negative. Reflexes are 2+ throughout. Toes are downgoing bilaterally. Fine motor skills are intact with normal finger taps, normal hand movements, normal rapid alternating patting, normal foot taps and normal foot agility.  Cerebellar testing shows no dysmetria or intention tremor on finger to nose testing. Heel to shin is unremarkable bilaterally. There is no truncal or gait ataxia.  Sensory exam is intact to light touch.  Gait, station and balance are unremarkable. No veering to one side is noted. No leaning to one side is noted. Posture is age-appropriate and stance is narrow based. No problems turning are noted. She turns en bloc.   Assessment and Plan:   In summary, LEMON WHITACRE is a very pleasant 68 y.o.-year old female with a fairly benign underlying medical history, who recently had a home sleep test indicating overall mild obstructive sleep apnea based on the desaturations and her AHI. Her overall AHI may have been 21.2 per hour but this includes central events which could have been post arousal central. Her desaturation was mild. She does not endorse much in the way of sleep related symptoms. In particular, she denies recurrent headaches, daytime somnolence, memory problems her mood related issues or any significant sleep disruption. She has a longer standing history of sleep maintenance problems for which he is taking 5 mg of Ambien in the early part of the night. I had a detailed conversation with the patient about her  sleep related Sx and her recent HST results. We talked about OSA in general and mild OSA, its prognosis and treatment options. We talked about medical treatments and non-pharmacological approaches. I explained in particular the risks and ramifications of untreated moderate to severe OSA, especially with respect to developing cardiovascular disease down the Road, including congestive heart failure, difficult to treat hypertension, cardiac arrhythmias, or stroke. Even type 2 diabetes has in part been linked to untreated OSA. I think we do not have to treat her for her mild sleep apnea, unless she were to develop additional symptoms, such as  daytime sleepiness, recurrent headaches, gasping sensations in sleep, memory or mood issues or is observed by friends and family to have more frequent apneic pauses in sleep. Her cardiovascular risk factor profile is low and from the medical standpoint, we have no compelling reason to treat her. Realistically, her treatment options include an auto-CPAP trial or a dental appliance. She can consider this and call me back if she decides to pursue treatment. Given her overbite, and overall mild sleep disorder breathing, she may be a good candidate for an oral appliance. We can send her to Dr. Augustina Mood for dental issues and to consider a dental device. She is on the lookout for her new dentist as well. At this juncture, I will see her back as needed. She is advised to sleep on her sides if possible. She is not obese and weight loss would not be a significant treatment option for her.  I answered all her questions today and the patient was in agreement.  Thank you very much for allowing me to participate in the care of this nice patient. If I can be of any further assistance to you please do not hesitate to call me at [phone].  Sincerely,   Star Age, MD, PhD

## 2013-10-19 NOTE — Patient Instructions (Addendum)
I think we do not have to treat you for your mild sleep apnea, unless you have additional symptoms, such as daytime sleepiness, recurrent headaches, gasping sensations in sleep, memory or mood issues. Your cardiovascular risk factor profile is low and from the medical standpoint, we have no compelling reason to treat you. Treatment options include an auto-CPAP or a dental appliance. You can think about it and let me know. We can send you to Dr. Augustina Mood for dental issues and to consider a dental device for treatment of your mild OSA.  I will see you back as needed.

## 2013-10-23 ENCOUNTER — Encounter: Payer: Self-pay | Admitting: Neurology

## 2013-11-13 ENCOUNTER — Encounter: Payer: Self-pay | Admitting: *Deleted

## 2013-11-14 ENCOUNTER — Ambulatory Visit (INDEPENDENT_AMBULATORY_CARE_PROVIDER_SITE_OTHER): Payer: Medicare HMO | Admitting: *Deleted

## 2013-11-14 DIAGNOSIS — I781 Nevus, non-neoplastic: Secondary | ICD-10-CM

## 2013-11-14 NOTE — Progress Notes (Signed)
X=.3% Sotradecol administered with a 27g butterfly.  Patient received a total of 3cc.  Cutaneous Laser:pulsed mode  810 j/cm2  400 ms delay  66ms duration  .5 spot  Total pulses: 427 Total energy 678.22  Total time::05  Photos: yes  Compression stockings applied: yes  Treated some flat reticulars that aren't painful just unattractive. Reflux not suspected. Used the CL on a few tiny vessels and on her face since I didn't use the entire syringe. Tol well. Will follow prn.

## 2013-11-16 ENCOUNTER — Encounter: Payer: Self-pay | Admitting: Vascular Surgery

## 2013-11-22 ENCOUNTER — Other Ambulatory Visit: Payer: Self-pay | Admitting: Internal Medicine

## 2013-11-22 ENCOUNTER — Ambulatory Visit
Admission: RE | Admit: 2013-11-22 | Discharge: 2013-11-22 | Disposition: A | Discharge status: Home / Self Care | Payer: PRIVATE HEALTH INSURANCE | Source: Ambulatory Visit | Attending: Internal Medicine | Admitting: Internal Medicine

## 2013-11-22 DIAGNOSIS — M545 Low back pain, unspecified: Secondary | ICD-10-CM

## 2014-03-01 ENCOUNTER — Encounter (HOSPITAL_COMMUNITY): Payer: Self-pay

## 2014-03-01 ENCOUNTER — Other Ambulatory Visit (HOSPITAL_COMMUNITY): Payer: Self-pay | Admitting: Internal Medicine

## 2014-03-01 ENCOUNTER — Ambulatory Visit (HOSPITAL_COMMUNITY)
Admission: RE | Admit: 2014-03-01 | Discharge: 2014-03-01 | Disposition: A | Discharge status: Home / Self Care | Payer: Medicare HMO | Source: Ambulatory Visit | Attending: Internal Medicine | Admitting: Internal Medicine

## 2014-03-01 DIAGNOSIS — R109 Unspecified abdominal pain: Secondary | ICD-10-CM

## 2014-03-01 DIAGNOSIS — R1013 Epigastric pain: Secondary | ICD-10-CM

## 2014-03-01 DIAGNOSIS — M47817 Spondylosis without myelopathy or radiculopathy, lumbosacral region: Secondary | ICD-10-CM | POA: Insufficient documentation

## 2014-03-01 LAB — BASIC METABOLIC PANEL
BUN: 16 mg/dL (ref 6–23)
CHLORIDE: 100 meq/L (ref 96–112)
CO2: 25 meq/L (ref 19–32)
Calcium: 9.1 mg/dL (ref 8.4–10.5)
Creatinine, Ser: 0.8 mg/dL (ref 0.50–1.10)
GFR calc Af Amer: 87 mL/min — ABNORMAL LOW (ref >90)
GFR, EST NON AFRICAN AMERICAN: 75 mL/min — AB (ref >90)
GLUCOSE: 94 mg/dL (ref 70–99)
POTASSIUM: 3.9 meq/L (ref 3.7–5.3)
Sodium: 137 mEq/L (ref 137–147)

## 2014-03-01 MED ORDER — IOHEXOL 300 MG/ML  SOLN
100.0000 mL | Freq: Once | INTRAMUSCULAR | Status: AC | PRN
  Administered 2014-03-01: 100 mL via INTRAVENOUS

## 2014-03-11 ENCOUNTER — Other Ambulatory Visit: Payer: Self-pay

## 2014-03-11 DIAGNOSIS — Z1231 Encounter for screening mammogram for malignant neoplasm of breast: Secondary | ICD-10-CM

## 2014-03-20 ENCOUNTER — Ambulatory Visit
Admission: RE | Admit: 2014-03-20 | Discharge: 2014-03-20 | Disposition: A | Discharge status: Home / Self Care | Payer: Commercial Managed Care - HMO | Source: Ambulatory Visit

## 2014-03-20 DIAGNOSIS — Z1231 Encounter for screening mammogram for malignant neoplasm of breast: Secondary | ICD-10-CM

## 2015-03-11 ENCOUNTER — Other Ambulatory Visit: Payer: Self-pay

## 2015-03-11 DIAGNOSIS — Z1231 Encounter for screening mammogram for malignant neoplasm of breast: Secondary | ICD-10-CM

## 2015-04-10 ENCOUNTER — Ambulatory Visit
Admission: RE | Admit: 2015-04-10 | Discharge: 2015-04-10 | Disposition: A | Discharge status: Home / Self Care | Payer: PPO | Source: Ambulatory Visit

## 2015-04-10 DIAGNOSIS — Z1231 Encounter for screening mammogram for malignant neoplasm of breast: Secondary | ICD-10-CM

## 2015-05-21 ENCOUNTER — Encounter: Payer: Self-pay | Admitting: Internal Medicine

## 2015-09-03 ENCOUNTER — Other Ambulatory Visit: Payer: Self-pay | Admitting: Internal Medicine

## 2015-09-03 ENCOUNTER — Ambulatory Visit
Admission: RE | Admit: 2015-09-03 | Discharge: 2015-09-03 | Disposition: A | Discharge status: Home / Self Care | Payer: PPO | Source: Ambulatory Visit | Attending: Internal Medicine | Admitting: Internal Medicine

## 2015-09-03 DIAGNOSIS — M542 Cervicalgia: Secondary | ICD-10-CM

## 2015-10-08 ENCOUNTER — Other Ambulatory Visit: Payer: Self-pay | Admitting: Internal Medicine

## 2015-10-08 DIAGNOSIS — K838 Other specified diseases of biliary tract: Secondary | ICD-10-CM

## 2015-10-10 ENCOUNTER — Other Ambulatory Visit: Payer: PPO

## 2015-10-15 ENCOUNTER — Other Ambulatory Visit: Payer: Self-pay | Admitting: Internal Medicine

## 2015-10-15 ENCOUNTER — Inpatient Hospital Stay
Admission: RE | Admit: 2015-10-15 | Discharge: 2015-10-15 | Disposition: A | Discharge status: Home / Self Care | Payer: Self-pay | Source: Ambulatory Visit | Attending: Internal Medicine | Admitting: Internal Medicine

## 2015-10-15 ENCOUNTER — Ambulatory Visit
Admission: RE | Admit: 2015-10-15 | Discharge: 2015-10-15 | Disposition: A | Discharge status: Home / Self Care | Payer: PPO | Source: Ambulatory Visit | Attending: Internal Medicine | Admitting: Internal Medicine

## 2015-10-15 DIAGNOSIS — R935 Abnormal findings on diagnostic imaging of other abdominal regions, including retroperitoneum: Secondary | ICD-10-CM | POA: Diagnosis not present

## 2015-10-15 DIAGNOSIS — K838 Other specified diseases of biliary tract: Secondary | ICD-10-CM | POA: Diagnosis not present

## 2015-10-15 MED ORDER — GADOBENATE DIMEGLUMINE 529 MG/ML IV SOLN
13.0000 mL | Freq: Once | INTRAVENOUS | Status: AC | PRN
  Administered 2015-10-15: 13 mL via INTRAVENOUS

## 2015-11-24 DIAGNOSIS — S6981XD Other specified injuries of right wrist, hand and finger(s), subsequent encounter: Secondary | ICD-10-CM | POA: Diagnosis not present

## 2015-11-24 DIAGNOSIS — M25531 Pain in right wrist: Secondary | ICD-10-CM | POA: Diagnosis not present

## 2015-12-01 DIAGNOSIS — H5340 Unspecified visual field defects: Secondary | ICD-10-CM | POA: Diagnosis not present

## 2015-12-01 DIAGNOSIS — H02831 Dermatochalasis of right upper eyelid: Secondary | ICD-10-CM | POA: Diagnosis not present

## 2015-12-01 DIAGNOSIS — H02413 Mechanical ptosis of bilateral eyelids: Secondary | ICD-10-CM | POA: Diagnosis not present

## 2015-12-01 DIAGNOSIS — H02834 Dermatochalasis of left upper eyelid: Secondary | ICD-10-CM | POA: Diagnosis not present

## 2015-12-22 DIAGNOSIS — M25531 Pain in right wrist: Secondary | ICD-10-CM | POA: Diagnosis not present

## 2015-12-22 DIAGNOSIS — M859 Disorder of bone density and structure, unspecified: Secondary | ICD-10-CM | POA: Diagnosis not present

## 2015-12-22 DIAGNOSIS — S6981XD Other specified injuries of right wrist, hand and finger(s), subsequent encounter: Secondary | ICD-10-CM | POA: Diagnosis not present

## 2015-12-22 DIAGNOSIS — E784 Other hyperlipidemia: Secondary | ICD-10-CM | POA: Diagnosis not present

## 2015-12-24 DIAGNOSIS — B0229 Other postherpetic nervous system involvement: Secondary | ICD-10-CM | POA: Diagnosis not present

## 2015-12-24 DIAGNOSIS — M810 Age-related osteoporosis without current pathological fracture: Secondary | ICD-10-CM | POA: Diagnosis not present

## 2015-12-24 DIAGNOSIS — M545 Low back pain: Secondary | ICD-10-CM | POA: Diagnosis not present

## 2015-12-24 DIAGNOSIS — Z1231 Encounter for screening mammogram for malignant neoplasm of breast: Secondary | ICD-10-CM | POA: Diagnosis not present

## 2015-12-24 DIAGNOSIS — Z1389 Encounter for screening for other disorder: Secondary | ICD-10-CM | POA: Diagnosis not present

## 2015-12-24 DIAGNOSIS — Z6825 Body mass index (BMI) 25.0-25.9, adult: Secondary | ICD-10-CM | POA: Diagnosis not present

## 2015-12-24 DIAGNOSIS — M542 Cervicalgia: Secondary | ICD-10-CM | POA: Diagnosis not present

## 2015-12-24 DIAGNOSIS — K838 Other specified diseases of biliary tract: Secondary | ICD-10-CM | POA: Diagnosis not present

## 2015-12-24 DIAGNOSIS — R51 Headache: Secondary | ICD-10-CM | POA: Diagnosis not present

## 2015-12-24 DIAGNOSIS — Z0000 Encounter for general adult medical examination without abnormal findings: Secondary | ICD-10-CM | POA: Diagnosis not present

## 2015-12-24 DIAGNOSIS — R002 Palpitations: Secondary | ICD-10-CM | POA: Diagnosis not present

## 2015-12-24 DIAGNOSIS — E784 Other hyperlipidemia: Secondary | ICD-10-CM | POA: Diagnosis not present

## 2015-12-26 DIAGNOSIS — H2513 Age-related nuclear cataract, bilateral: Secondary | ICD-10-CM | POA: Diagnosis not present

## 2015-12-26 DIAGNOSIS — Z0100 Encounter for examination of eyes and vision without abnormal findings: Secondary | ICD-10-CM | POA: Diagnosis not present

## 2016-01-20 DIAGNOSIS — M25531 Pain in right wrist: Secondary | ICD-10-CM | POA: Diagnosis not present

## 2016-01-20 DIAGNOSIS — S6981XD Other specified injuries of right wrist, hand and finger(s), subsequent encounter: Secondary | ICD-10-CM | POA: Diagnosis not present

## 2016-02-05 DIAGNOSIS — M542 Cervicalgia: Secondary | ICD-10-CM | POA: Diagnosis not present

## 2016-02-05 DIAGNOSIS — Z6824 Body mass index (BMI) 24.0-24.9, adult: Secondary | ICD-10-CM | POA: Diagnosis not present

## 2016-03-11 ENCOUNTER — Other Ambulatory Visit: Payer: Self-pay | Admitting: Internal Medicine

## 2016-03-11 DIAGNOSIS — Z1231 Encounter for screening mammogram for malignant neoplasm of breast: Secondary | ICD-10-CM

## 2016-04-14 ENCOUNTER — Ambulatory Visit: Payer: PPO

## 2016-04-14 ENCOUNTER — Ambulatory Visit
Admission: RE | Admit: 2016-04-14 | Discharge: 2016-04-14 | Disposition: A | Discharge status: Home / Self Care | Payer: PPO | Source: Ambulatory Visit | Attending: Internal Medicine | Admitting: Internal Medicine

## 2016-04-14 DIAGNOSIS — Z1231 Encounter for screening mammogram for malignant neoplasm of breast: Secondary | ICD-10-CM | POA: Diagnosis not present

## 2016-04-21 ENCOUNTER — Ambulatory Visit: Payer: PPO

## 2016-05-12 DIAGNOSIS — S6981XD Other specified injuries of right wrist, hand and finger(s), subsequent encounter: Secondary | ICD-10-CM | POA: Diagnosis not present

## 2016-05-12 DIAGNOSIS — M25531 Pain in right wrist: Secondary | ICD-10-CM | POA: Diagnosis not present

## 2016-06-02 DIAGNOSIS — M810 Age-related osteoporosis without current pathological fracture: Secondary | ICD-10-CM | POA: Diagnosis not present

## 2016-07-07 ENCOUNTER — Encounter: Payer: Self-pay | Admitting: *Deleted

## 2016-07-14 ENCOUNTER — Ambulatory Visit (INDEPENDENT_AMBULATORY_CARE_PROVIDER_SITE_OTHER): Payer: PPO | Admitting: *Deleted

## 2016-07-14 DIAGNOSIS — I781 Nevus, non-neoplastic: Secondary | ICD-10-CM

## 2016-07-14 NOTE — Progress Notes (Signed)
   Cutaneous Laser:pulsed mode  592j/cm2 400 ms delay  10 ms Duration 0.5 spot  Total pulses: 576Total energy 667.08  Total time::05  Treated facial capillaries. Reaction to laser was as expected. Anticipate good results. Follow prn.

## 2016-08-24 ENCOUNTER — Ambulatory Visit (INDEPENDENT_AMBULATORY_CARE_PROVIDER_SITE_OTHER): Payer: PPO

## 2016-08-24 DIAGNOSIS — Z23 Encounter for immunization: Secondary | ICD-10-CM

## 2016-08-28 DIAGNOSIS — M25531 Pain in right wrist: Secondary | ICD-10-CM | POA: Diagnosis not present

## 2016-09-29 DIAGNOSIS — M19071 Primary osteoarthritis, right ankle and foot: Secondary | ICD-10-CM | POA: Diagnosis not present

## 2016-09-29 DIAGNOSIS — M2041 Other hammer toe(s) (acquired), right foot: Secondary | ICD-10-CM | POA: Diagnosis not present

## 2016-09-29 DIAGNOSIS — M7662 Achilles tendinitis, left leg: Secondary | ICD-10-CM | POA: Diagnosis not present

## 2016-09-29 DIAGNOSIS — M6588 Other synovitis and tenosynovitis, other site: Secondary | ICD-10-CM | POA: Diagnosis not present

## 2016-11-10 DIAGNOSIS — M79671 Pain in right foot: Secondary | ICD-10-CM | POA: Diagnosis not present

## 2016-11-10 DIAGNOSIS — M79672 Pain in left foot: Secondary | ICD-10-CM | POA: Diagnosis not present

## 2016-11-24 DIAGNOSIS — M25531 Pain in right wrist: Secondary | ICD-10-CM | POA: Diagnosis not present

## 2016-12-06 DIAGNOSIS — M25531 Pain in right wrist: Secondary | ICD-10-CM | POA: Diagnosis not present

## 2016-12-10 DIAGNOSIS — M25531 Pain in right wrist: Secondary | ICD-10-CM | POA: Diagnosis not present

## 2016-12-10 DIAGNOSIS — M25831 Other specified joint disorders, right wrist: Secondary | ICD-10-CM | POA: Diagnosis not present

## 2016-12-10 DIAGNOSIS — S63591D Other specified sprain of right wrist, subsequent encounter: Secondary | ICD-10-CM | POA: Diagnosis not present

## 2016-12-28 DIAGNOSIS — H524 Presbyopia: Secondary | ICD-10-CM | POA: Diagnosis not present

## 2016-12-28 DIAGNOSIS — H2513 Age-related nuclear cataract, bilateral: Secondary | ICD-10-CM | POA: Diagnosis not present

## 2017-01-17 DIAGNOSIS — M7542 Impingement syndrome of left shoulder: Secondary | ICD-10-CM | POA: Diagnosis not present

## 2017-01-19 DIAGNOSIS — M810 Age-related osteoporosis without current pathological fracture: Secondary | ICD-10-CM | POA: Diagnosis not present

## 2017-01-19 DIAGNOSIS — Z0000 Encounter for general adult medical examination without abnormal findings: Secondary | ICD-10-CM | POA: Diagnosis not present

## 2017-01-19 DIAGNOSIS — D72819 Decreased white blood cell count, unspecified: Secondary | ICD-10-CM | POA: Diagnosis not present

## 2017-01-19 DIAGNOSIS — E784 Other hyperlipidemia: Secondary | ICD-10-CM | POA: Diagnosis not present

## 2017-01-25 DIAGNOSIS — M545 Low back pain: Secondary | ICD-10-CM | POA: Diagnosis not present

## 2017-01-25 DIAGNOSIS — R002 Palpitations: Secondary | ICD-10-CM | POA: Diagnosis not present

## 2017-01-25 DIAGNOSIS — M542 Cervicalgia: Secondary | ICD-10-CM | POA: Diagnosis not present

## 2017-01-25 DIAGNOSIS — Z0000 Encounter for general adult medical examination without abnormal findings: Secondary | ICD-10-CM | POA: Diagnosis not present

## 2017-01-25 DIAGNOSIS — M810 Age-related osteoporosis without current pathological fracture: Secondary | ICD-10-CM | POA: Diagnosis not present

## 2017-01-25 DIAGNOSIS — R51 Headache: Secondary | ICD-10-CM | POA: Diagnosis not present

## 2017-01-25 DIAGNOSIS — E784 Other hyperlipidemia: Secondary | ICD-10-CM | POA: Diagnosis not present

## 2017-01-25 DIAGNOSIS — Z6824 Body mass index (BMI) 24.0-24.9, adult: Secondary | ICD-10-CM | POA: Diagnosis not present

## 2017-01-25 DIAGNOSIS — G4733 Obstructive sleep apnea (adult) (pediatric): Secondary | ICD-10-CM | POA: Diagnosis not present

## 2017-01-25 DIAGNOSIS — Z1389 Encounter for screening for other disorder: Secondary | ICD-10-CM | POA: Diagnosis not present

## 2017-01-25 DIAGNOSIS — B0229 Other postherpetic nervous system involvement: Secondary | ICD-10-CM | POA: Diagnosis not present

## 2017-01-25 DIAGNOSIS — K838 Other specified diseases of biliary tract: Secondary | ICD-10-CM | POA: Diagnosis not present

## 2017-01-31 DIAGNOSIS — Z1212 Encounter for screening for malignant neoplasm of rectum: Secondary | ICD-10-CM | POA: Diagnosis not present

## 2017-03-18 ENCOUNTER — Other Ambulatory Visit: Payer: Self-pay | Admitting: Internal Medicine

## 2017-03-18 DIAGNOSIS — Z1231 Encounter for screening mammogram for malignant neoplasm of breast: Secondary | ICD-10-CM

## 2017-06-01 ENCOUNTER — Ambulatory Visit
Admission: RE | Admit: 2017-06-01 | Discharge: 2017-06-01 | Disposition: A | Discharge status: Home / Self Care | Payer: PPO | Source: Ambulatory Visit | Attending: Internal Medicine | Admitting: Internal Medicine

## 2017-06-01 ENCOUNTER — Other Ambulatory Visit: Payer: Self-pay | Admitting: Internal Medicine

## 2017-06-01 DIAGNOSIS — N6310 Unspecified lump in the right breast, unspecified quadrant: Secondary | ICD-10-CM

## 2017-06-01 DIAGNOSIS — N6489 Other specified disorders of breast: Secondary | ICD-10-CM | POA: Diagnosis not present

## 2017-06-01 DIAGNOSIS — R928 Other abnormal and inconclusive findings on diagnostic imaging of breast: Secondary | ICD-10-CM | POA: Diagnosis not present

## 2017-06-01 DIAGNOSIS — Z1231 Encounter for screening mammogram for malignant neoplasm of breast: Secondary | ICD-10-CM

## 2017-06-28 DIAGNOSIS — M19031 Primary osteoarthritis, right wrist: Secondary | ICD-10-CM | POA: Diagnosis not present

## 2017-06-28 DIAGNOSIS — M24831 Other specific joint derangements of right wrist, not elsewhere classified: Secondary | ICD-10-CM | POA: Diagnosis not present

## 2017-06-28 DIAGNOSIS — M24131 Other articular cartilage disorders, right wrist: Secondary | ICD-10-CM | POA: Diagnosis not present

## 2017-06-28 DIAGNOSIS — G8918 Other acute postprocedural pain: Secondary | ICD-10-CM | POA: Diagnosis not present

## 2017-06-28 DIAGNOSIS — M25331 Other instability, right wrist: Secondary | ICD-10-CM | POA: Diagnosis not present

## 2017-07-11 DIAGNOSIS — M25831 Other specified joint disorders, right wrist: Secondary | ICD-10-CM | POA: Diagnosis not present

## 2017-07-11 DIAGNOSIS — M25531 Pain in right wrist: Secondary | ICD-10-CM | POA: Diagnosis not present

## 2017-07-22 DIAGNOSIS — M25531 Pain in right wrist: Secondary | ICD-10-CM | POA: Diagnosis not present

## 2017-08-02 DIAGNOSIS — M25531 Pain in right wrist: Secondary | ICD-10-CM | POA: Diagnosis not present

## 2017-08-08 DIAGNOSIS — M25531 Pain in right wrist: Secondary | ICD-10-CM | POA: Diagnosis not present

## 2017-08-11 DIAGNOSIS — S63591D Other specified sprain of right wrist, subsequent encounter: Secondary | ICD-10-CM | POA: Diagnosis not present

## 2017-08-11 DIAGNOSIS — M65841 Other synovitis and tenosynovitis, right hand: Secondary | ICD-10-CM | POA: Diagnosis not present

## 2017-09-05 DIAGNOSIS — S63591D Other specified sprain of right wrist, subsequent encounter: Secondary | ICD-10-CM | POA: Diagnosis not present

## 2017-09-26 DIAGNOSIS — M25531 Pain in right wrist: Secondary | ICD-10-CM | POA: Diagnosis not present

## 2017-10-05 DIAGNOSIS — M25531 Pain in right wrist: Secondary | ICD-10-CM | POA: Diagnosis not present

## 2017-10-05 DIAGNOSIS — M25831 Other specified joint disorders, right wrist: Secondary | ICD-10-CM | POA: Diagnosis not present

## 2017-10-05 DIAGNOSIS — S63591D Other specified sprain of right wrist, subsequent encounter: Secondary | ICD-10-CM | POA: Diagnosis not present

## 2017-11-21 DIAGNOSIS — M9903 Segmental and somatic dysfunction of lumbar region: Secondary | ICD-10-CM | POA: Diagnosis not present

## 2017-11-21 DIAGNOSIS — Z1389 Encounter for screening for other disorder: Secondary | ICD-10-CM | POA: Diagnosis not present

## 2017-11-21 DIAGNOSIS — M5441 Lumbago with sciatica, right side: Secondary | ICD-10-CM | POA: Diagnosis not present

## 2017-11-21 DIAGNOSIS — M5416 Radiculopathy, lumbar region: Secondary | ICD-10-CM | POA: Diagnosis not present

## 2017-11-22 DIAGNOSIS — M9903 Segmental and somatic dysfunction of lumbar region: Secondary | ICD-10-CM | POA: Diagnosis not present

## 2017-11-22 DIAGNOSIS — M5441 Lumbago with sciatica, right side: Secondary | ICD-10-CM | POA: Diagnosis not present

## 2017-11-23 ENCOUNTER — Other Ambulatory Visit: Payer: Self-pay | Admitting: Internal Medicine

## 2017-11-23 DIAGNOSIS — M9903 Segmental and somatic dysfunction of lumbar region: Secondary | ICD-10-CM | POA: Diagnosis not present

## 2017-11-23 DIAGNOSIS — M5416 Radiculopathy, lumbar region: Secondary | ICD-10-CM

## 2017-11-23 DIAGNOSIS — M5441 Lumbago with sciatica, right side: Secondary | ICD-10-CM | POA: Diagnosis not present

## 2017-11-28 DIAGNOSIS — M5441 Lumbago with sciatica, right side: Secondary | ICD-10-CM | POA: Diagnosis not present

## 2017-11-28 DIAGNOSIS — M9903 Segmental and somatic dysfunction of lumbar region: Secondary | ICD-10-CM | POA: Diagnosis not present

## 2017-11-30 ENCOUNTER — Ambulatory Visit
Admission: RE | Admit: 2017-11-30 | Discharge: 2017-11-30 | Disposition: A | Discharge status: Home / Self Care | Payer: PPO | Source: Ambulatory Visit | Attending: Internal Medicine | Admitting: Internal Medicine

## 2017-11-30 ENCOUNTER — Other Ambulatory Visit: Payer: PPO

## 2017-11-30 DIAGNOSIS — M5441 Lumbago with sciatica, right side: Secondary | ICD-10-CM | POA: Diagnosis not present

## 2017-11-30 DIAGNOSIS — M5136 Other intervertebral disc degeneration, lumbar region: Secondary | ICD-10-CM | POA: Diagnosis not present

## 2017-11-30 DIAGNOSIS — M5416 Radiculopathy, lumbar region: Secondary | ICD-10-CM

## 2017-11-30 DIAGNOSIS — M9903 Segmental and somatic dysfunction of lumbar region: Secondary | ICD-10-CM | POA: Diagnosis not present

## 2017-12-07 DIAGNOSIS — S6981XA Other specified injuries of right wrist, hand and finger(s), initial encounter: Secondary | ICD-10-CM | POA: Diagnosis not present

## 2017-12-07 DIAGNOSIS — M25531 Pain in right wrist: Secondary | ICD-10-CM | POA: Diagnosis not present

## 2017-12-07 DIAGNOSIS — M25831 Other specified joint disorders, right wrist: Secondary | ICD-10-CM | POA: Diagnosis not present

## 2017-12-12 DIAGNOSIS — M5431 Sciatica, right side: Secondary | ICD-10-CM | POA: Diagnosis not present

## 2017-12-14 DIAGNOSIS — M5431 Sciatica, right side: Secondary | ICD-10-CM | POA: Diagnosis not present

## 2017-12-21 DIAGNOSIS — M5136 Other intervertebral disc degeneration, lumbar region: Secondary | ICD-10-CM | POA: Diagnosis not present

## 2017-12-21 DIAGNOSIS — M5431 Sciatica, right side: Secondary | ICD-10-CM | POA: Diagnosis not present

## 2017-12-29 DIAGNOSIS — M5431 Sciatica, right side: Secondary | ICD-10-CM | POA: Diagnosis not present

## 2018-01-05 DIAGNOSIS — M5136 Other intervertebral disc degeneration, lumbar region: Secondary | ICD-10-CM | POA: Diagnosis not present

## 2018-01-09 DIAGNOSIS — M5431 Sciatica, right side: Secondary | ICD-10-CM | POA: Diagnosis not present

## 2018-01-19 DIAGNOSIS — M5431 Sciatica, right side: Secondary | ICD-10-CM | POA: Diagnosis not present

## 2018-02-27 DIAGNOSIS — M810 Age-related osteoporosis without current pathological fracture: Secondary | ICD-10-CM | POA: Diagnosis not present

## 2018-02-27 DIAGNOSIS — E7849 Other hyperlipidemia: Secondary | ICD-10-CM | POA: Diagnosis not present

## 2018-02-27 DIAGNOSIS — R82998 Other abnormal findings in urine: Secondary | ICD-10-CM | POA: Diagnosis not present

## 2018-03-01 DIAGNOSIS — G4733 Obstructive sleep apnea (adult) (pediatric): Secondary | ICD-10-CM | POA: Diagnosis not present

## 2018-03-01 DIAGNOSIS — Z780 Asymptomatic menopausal state: Secondary | ICD-10-CM | POA: Diagnosis not present

## 2018-03-01 DIAGNOSIS — Z6824 Body mass index (BMI) 24.0-24.9, adult: Secondary | ICD-10-CM | POA: Diagnosis not present

## 2018-03-01 DIAGNOSIS — E7849 Other hyperlipidemia: Secondary | ICD-10-CM | POA: Diagnosis not present

## 2018-03-01 DIAGNOSIS — M5416 Radiculopathy, lumbar region: Secondary | ICD-10-CM | POA: Diagnosis not present

## 2018-03-01 DIAGNOSIS — R252 Cramp and spasm: Secondary | ICD-10-CM | POA: Diagnosis not present

## 2018-03-01 DIAGNOSIS — Z0000 Encounter for general adult medical examination without abnormal findings: Secondary | ICD-10-CM | POA: Diagnosis not present

## 2018-03-01 DIAGNOSIS — M542 Cervicalgia: Secondary | ICD-10-CM | POA: Diagnosis not present

## 2018-03-01 DIAGNOSIS — B0229 Other postherpetic nervous system involvement: Secondary | ICD-10-CM | POA: Diagnosis not present

## 2018-03-01 DIAGNOSIS — M545 Low back pain: Secondary | ICD-10-CM | POA: Diagnosis not present

## 2018-03-01 DIAGNOSIS — M810 Age-related osteoporosis without current pathological fracture: Secondary | ICD-10-CM | POA: Diagnosis not present

## 2018-03-01 DIAGNOSIS — Z1389 Encounter for screening for other disorder: Secondary | ICD-10-CM | POA: Diagnosis not present

## 2018-03-20 DIAGNOSIS — M25552 Pain in left hip: Secondary | ICD-10-CM | POA: Diagnosis not present

## 2018-03-20 DIAGNOSIS — M7541 Impingement syndrome of right shoulder: Secondary | ICD-10-CM | POA: Diagnosis not present

## 2018-04-19 DIAGNOSIS — M7541 Impingement syndrome of right shoulder: Secondary | ICD-10-CM | POA: Diagnosis not present

## 2018-04-27 DIAGNOSIS — M542 Cervicalgia: Secondary | ICD-10-CM | POA: Diagnosis not present

## 2018-06-20 ENCOUNTER — Other Ambulatory Visit: Payer: Self-pay | Admitting: Internal Medicine

## 2018-06-20 DIAGNOSIS — Z1231 Encounter for screening mammogram for malignant neoplasm of breast: Secondary | ICD-10-CM

## 2018-06-30 DIAGNOSIS — M5020 Other cervical disc displacement, unspecified cervical region: Secondary | ICD-10-CM | POA: Diagnosis not present

## 2018-06-30 DIAGNOSIS — M542 Cervicalgia: Secondary | ICD-10-CM | POA: Diagnosis not present

## 2018-07-03 DIAGNOSIS — M25831 Other specified joint disorders, right wrist: Secondary | ICD-10-CM | POA: Diagnosis not present

## 2018-07-03 DIAGNOSIS — M25531 Pain in right wrist: Secondary | ICD-10-CM | POA: Diagnosis not present

## 2018-07-04 DIAGNOSIS — M810 Age-related osteoporosis without current pathological fracture: Secondary | ICD-10-CM | POA: Diagnosis not present

## 2018-07-05 DIAGNOSIS — M5020 Other cervical disc displacement, unspecified cervical region: Secondary | ICD-10-CM | POA: Diagnosis not present

## 2018-07-05 DIAGNOSIS — M542 Cervicalgia: Secondary | ICD-10-CM | POA: Diagnosis not present

## 2018-07-17 ENCOUNTER — Ambulatory Visit
Admission: RE | Admit: 2018-07-17 | Discharge: 2018-07-17 | Disposition: A | Discharge status: Home / Self Care | Payer: PPO | Source: Ambulatory Visit | Attending: Internal Medicine | Admitting: Internal Medicine

## 2018-07-17 DIAGNOSIS — Z1231 Encounter for screening mammogram for malignant neoplasm of breast: Secondary | ICD-10-CM | POA: Diagnosis not present

## 2018-07-19 DIAGNOSIS — M5020 Other cervical disc displacement, unspecified cervical region: Secondary | ICD-10-CM | POA: Diagnosis not present

## 2018-07-19 DIAGNOSIS — M542 Cervicalgia: Secondary | ICD-10-CM | POA: Diagnosis not present

## 2018-07-26 DIAGNOSIS — M810 Age-related osteoporosis without current pathological fracture: Secondary | ICD-10-CM | POA: Diagnosis not present

## 2018-07-26 DIAGNOSIS — Z6824 Body mass index (BMI) 24.0-24.9, adult: Secondary | ICD-10-CM | POA: Diagnosis not present

## 2018-07-26 DIAGNOSIS — G4709 Other insomnia: Secondary | ICD-10-CM | POA: Diagnosis not present

## 2018-08-02 DIAGNOSIS — Z23 Encounter for immunization: Secondary | ICD-10-CM | POA: Diagnosis not present

## 2018-08-08 ENCOUNTER — Encounter: Payer: Self-pay | Admitting: Neurology

## 2018-08-10 ENCOUNTER — Encounter: Payer: Self-pay | Admitting: Neurology

## 2018-08-10 ENCOUNTER — Ambulatory Visit (INDEPENDENT_AMBULATORY_CARE_PROVIDER_SITE_OTHER): Payer: PPO | Admitting: Neurology

## 2018-08-10 VITALS — BP 112/70 | HR 69 | Ht 65.5 in | Wt 152.0 lb

## 2018-08-10 DIAGNOSIS — G478 Other sleep disorders: Secondary | ICD-10-CM | POA: Diagnosis not present

## 2018-08-10 DIAGNOSIS — G4701 Insomnia due to medical condition: Secondary | ICD-10-CM | POA: Diagnosis not present

## 2018-08-10 NOTE — Progress Notes (Signed)
SLEEP MEDICINE CLINIC   Provider:  Larey Seat, M.D.   Primary Care Physician:  Crist Infante, MD   Referring Provider: Crist Infante, MD    Chief Complaint  Patient presents with  . Sleep Consult    rm 11    HPI:  Stacey Short is a 72 y.o. female patient, seen here on 08-10-2018 in a referral from Dr. Joylene Draft for new sleep evaluation,   [name] has had trouble to initiate sleep without medication and to stay asleep without medication. She remembers the onset of these problem after menopause , at age 32.   Part of her work-up in the past for these long-standing problems have been a attended sleep study that was performed at the [facility].  The patient estimates that it may have been more than 8 years ago.  At the time based on these attended sleep study results she was not diagnosed with apnea. She was then discussing with her husband, Dr. Jeanmarie Hubert, what to do next- and he ordered a home sleep test in November 2014 for her.   At the time her husband had complained about her apnea and habitual loud snoring and he still maintains that observation.  The HST-respiratory disturbance index was analyzed at an RDI of 21.2/h and after these ambulatory data were stripped of artifact the analyzed sleep time was only 181 minutes ( which means the patient slept only 3 hours for sure) her RDI now was 30.5/h - there were periods of very high indices of apnea present which may have reflected REM sleep.  Treatment options were discussed with my colleague Star Age 5 years ago who stated that these mild forms of apnea and given her overbite and retrognathia may be more amendable to a dental device, she also should avoid sleeping on her back.  She also commented that weight loss would not be offering a valid treatment option in a patient that already has a BMI of 25 and works out three times a week.   Chief complaint according to patient : " I don't think that I have  sleep apnea" - "I need pills to sleep"- and " I take Belsomra and little bites of Ambien "   Sleep habits are as follows:  Dinner time is usually around 7.30 Pm, and bedtime is around 11-midnight, the bedroom is cool, quiet , and  dark without electronics or noise infiltrating. She sleeps on her side, shares the bed with her husband,  in a [place] size bed. With Belsomra on board she is asleep in 15 minutes, and she wakes 2.5 hours later, has to go the bathroom. She then takes a tiny bite of the Ambien tablet and sleeps for another 4-5 hours. Feels rested in AM, no headaches, sometimes a stiff neck, no dry mouth, no palpitations. She dreams, but does not recall most dreams- and feels these are pleasant , not anxiety provoking. She wake sup spontaously  at 6.30 AM, gets her cup of coffee and returns to bed to read for another 30 minutes- she feels Stacey Short is the best sleep a she had in a long time.    Sleep medical history and family sleep history: insomnia since menopause, at age 21.  Mother was a light sleeper.    Social history: married to physician,  Two biological adult children, 24 and 2 years old, daughter is a PhD. Non  Smoker - quit in 1984, ETOH - daily having wine with dinner, one  cocktail on weekend. Caffeine - uses ice tea when eating out at lunch -all decaffeinated beverages.     Review of Systems: Out of a complete 14 system review, the patient complains of only the following symptoms, and all other reviewed systems are negative. Geriatric depression score 0/ 15   Epworth Sleepiness score endorsed at 9/ 24 points  , Fatigue severity score 19/63 , snoring according to husband.  Social History   Socioeconomic History  . Marital status: Married    Spouse name: Not on file  . Number of children: 5  . Years of education: Not on file  . Highest education level: Master's degree (e.g., MA, MS, MEng, MEd, MSW, MBA)  Occupational History    Comment: retired  Scientific laboratory technician  . Financial  resource strain: Not on file  . Food insecurity:    Worry: Not on file    Inability: Not on file  . Transportation needs:    Medical: Not on file    Non-medical: Not on file  Tobacco Use  . Smoking status: Former Smoker    Last attempt to quit: 10/11/1982    Years since quitting: 35.8  . Smokeless tobacco: Never Used  Substance and Sexual Activity  . Alcohol use: Yes    Comment: 2 glasses wine daily  . Drug use: No  . Sexual activity: Not on file  Lifestyle  . Physical activity:    Days per week: Not on file    Minutes per session: Not on file  . Stress: Not on file  Relationships  . Social connections:    Talks on phone: Not on file    Gets together: Not on file    Attends religious service: Not on file    Active member of club or organization: Not on file    Attends meetings of clubs or organizations: Not on file    Relationship status: Not on file  . Intimate partner violence:    Fear of current or ex partner: Not on file    Emotionally abused: Not on file    Physically abused: Not on file    Forced sexual activity: Not on file  Other Topics Concern  . Not on file  Social History Narrative   Lives with husband   Caffeine- very little    Family History  Problem Relation Age of Onset  . Kidney cancer Sister   . Lymphoma Father   . Diabetes Mother   . Heart disease Mother   . Diabetes Sister   . Lupus Sister   . Colon cancer Neg Hx     Past Medical History:  Diagnosis Date  . COLONIC POLYPS, HYPERPLASTIC, HX OF   . H/O gastritis    egd 2009  ,h.pylori negative  . Hyperlipidemia   . Unspecified hemorrhoids without mention of complication     Past Surgical History:  Procedure Laterality Date  . BREAST REDUCTION SURGERY    . EXCISION/RELEASE BURSA HIP Right 09/26/2013   Procedure: RIGHT GLUTEAL BURSECTOMY, GLUTEAL TENDON DEBRIDEMENT ;  Surgeon: Gearlean Alf, MD;  Location: [facility];  Service: Orthopedics;  Laterality: Right;  . PARTIAL HYSTERECTOMY  1987   . Thumb joint repair    . TUMMY TUCK      Current Outpatient Medications  Medication Sig Dispense Refill  . estradiol (ESTRACE) 0.1 MG/GM vaginal cream Place 1 Applicatorful vaginally 2 (two) times a week.    . estradiol (VIVELLE-DOT) 0.025 MG/24HR Place 1 patch onto the skin 2 (two)  times a week.    . methocarbamol (ROBAXIN) 500 MG tablet Take 1 tablet (500 mg total) by mouth 4 (four) times daily. As needed for muscle spasm 30 tablet 1  . Multiple Vitamins-Minerals (MULTIVITAMIN PO) Take 1 tablet by mouth daily.     . Omega-3 Fatty Acids (FISH OIL PO) Take by mouth.    . rosuvastatin (CRESTOR) 10 MG tablet Take 5-10 mg by mouth daily with breakfast. Take 5 mg every other day, and 10 mg other days.    Marland Kitchen testosterone (ANDROGEL) 50 MG/5GM GEL 5 g 2 (two) times a week.    Marland Kitchen VITAMIN D, CHOLECALCIFEROL, PO Take 1 tablet by mouth daily.    Marland Kitchen zolpidem (AMBIEN) 10 MG tablet Take 5 mg by mouth at bedtime as needed.    . ALPRAZolam (XANAX) 1 MG tablet Take 1 tablet (1 mg total) by mouth at bedtime as needed for sleep. (Patient not taking: Reported on 08/10/2018) 60 tablet 3  . HYDROmorphone (DILAUDID) 2 MG tablet Take 1-2 tablets (2-4 mg total) by mouth every 4 (four) hours as needed for severe pain. (Patient not taking: Reported on 08/10/2018) 40 tablet 0  . RABEprazole (ACIPHEX) 20 MG tablet Take 20 mg by mouth daily as needed (ulcer).    . Suvorexant (BELSOMRA) 20 MG TABS Take 1 tablet by mouth at bedtime.     No current facility-administered medications for this visit.     Allergies as of 08/10/2018 - Review Complete 08/10/2018  Allergen Reaction Noted  . Celecoxib  04/24/2008  . Codeine  04/24/2008  . Ultram [tramadol] Nausea And Vomiting 09/20/2013    Vitals: BP 112/70   Pulse 69   Ht 5' 5.5" (1.664 m)   Wt 152 lb (68.9 kg)   BMI 24.91 kg/m  Last Weight:  Wt Readings from Last 1 Encounters:  08/10/18 152 lb (68.9 kg)   WNU:UVOZ mass index is 24.91 kg/m.     Last Height:   Ht  Readings from Last 1 Encounters:  08/10/18 5' 5.5" (1.664 m)    Physical exam:  General: The patient is awake, alert and appears not in acute distress. The patient is well groomed. Head: Normocephalic, atraumatic. Neck is supple. Mallampati 4  neck circumference:15". Nasal airflow patent, Retrognathia is noted.  Cardiovascular:  Regular rate and rhythm , without  murmurs or carotid bruit, and without distended neck veins. Respiratory: Lungs are clear to auscultation. Skin:  Without evidence of edema, or rash Trunk: BMI is 24.9. The patient's posture is erect.  Neurologic exam : The patient is awake and alert, oriented to place and time.  Attention span & concentration ability appears normal.  Speech is fluent,  without dysarthria, dysphonia or aphasia.  Mood and affect are appropriate.  Cranial nerves: Pupils are equal and briskly reactive to light. Funduscopic exam without evidence of pallor or edema. Extraocular movements  in vertical and horizontal planes intact and without nystagmus. Visual fields by finger perimetry are intact. Hearing to finger rub intact.   Facial sensation intact to fine touch.  Facial motor strength is symmetric and tongue and uvula move midline. Shoulder shrug was symmetrical.   Motor exam: Normal tone, muscle bulk and symmetric strength in all extremities. Sensory:  Fine touch, pinprick and vibration were normal. Coordination: no changes in penmanship- Finger-to-nose maneuver was  normal without evidence of ataxia, dysmetria or tremor.  Gait and station: Patient walks without assistive device and is able unassisted to climb up to the exam table. Strength within  normal limits.  Stance is stable and normal. Turns with 3 Steps.  Deep tendon reflexes: in the  upper and lower extremities are symmetric and intact. Babinski maneuver response is downgoing.    Assessment:  After physical and neurologic examination, review of laboratory studies,  Personal review  of imaging studies, reports of other /same  Imaging studies, results of polysomnography and / or neurophysiology testing and pre-existing records as far as provided in visit., my assessment is:   1) The patient has been snoring for years and struggled with Insomnia, yet her previous PSG did not document a significant sleep apnea by AHI- but her HST 5 years ago did. She has retrognathia ,she is slender, and she continues to need sleep aids to give her restfull sleep of 6 hours or more. I would repeat an attended sleep study to clarify that apnea is present or not, she can use her sleep aid during the study.    2) Reducing alcohol can help to achieve better sleep. She should continue to exercise. She likes to drink water.   3) She already has implemeneted very good sleep hygiene.    The patient was advised of the nature of the diagnosed disorder , the treatment options and the  risks for general health and wellness arising from not treating the condition.   I spent more than 45  minutes of face to face time with the patient.  Greater than 50% of time was spent in counseling and coordination of care. We have discussed the diagnosis and differential and I answered the patient's questions.    Plan:  Treatment plan and additional workup :  Attended sleep study to evaluate her rate of arousals and causes, need to be scored for AHI and for RDI !  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   This could explain why her husband, Dr. Elder Negus, has noted her to snore and it regularly breathes at times but it may not qualify to be scored as an apnea because it may not lead to enough oxygen saturation drop, and to not enough volume reduction.  Then she could  be helped by a dental device.   Larey Seat, MD 25/42/7062, 3:76 AM  Certified in Neurology by ABPN Certified in [place] by [facility] [address] [place], [place]  [id number]

## 2018-08-15 ENCOUNTER — Other Ambulatory Visit: Payer: Self-pay | Admitting: Neurology

## 2018-08-15 ENCOUNTER — Telehealth: Payer: Self-pay

## 2018-08-15 DIAGNOSIS — G478 Other sleep disorders: Secondary | ICD-10-CM

## 2018-08-15 DIAGNOSIS — G4701 Insomnia due to medical condition: Secondary | ICD-10-CM

## 2018-08-15 NOTE — Telephone Encounter (Signed)
Order placed for HST 

## 2018-08-15 NOTE — Telephone Encounter (Signed)
Health team advantage will deny in lab sleep study. Does not meet criteria. Need HST order

## 2018-09-11 ENCOUNTER — Other Ambulatory Visit: Payer: Self-pay | Admitting: Pharmacist

## 2018-09-11 NOTE — Patient Outreach (Signed)
[facility]) Care Management  09/11/2018  CAITRIN PENDERGRAPH 1945-12-17 [id number]   Incoming call from Mardella Layman in response to the [facility] Medication Adherence Campaign. Speak with patient. HIPAA identifiers verified and verbal consent received.  Ms. Hefel reports that she takes her rosuvastatin 10 mg as directed, 5 mg every other day and 10 mg other days. Denies any missed doses or barriers to adherence. Reports that while her provider is aware of how she takes this medication, her prescription bottle reads rosuvastatin 10mg  - take 1 tablet by mouth daily.   Ms. Horsfall denies any further medication questions/concerns at this time.  PLAN  1) Will contact patient's PCP to confirm that provider is aware that patient is currently taking the rosuvastatin 10 mg alternating 5 mg every other day and 10 mg other days. If provider wishes to continue with this dosing, will request that a new prescription be sent to her pharmacy reflecting this direction.  Harlow Asa, PharmD, [place] [facility] [phone]

## 2018-09-12 MED FILL — ROSUVASTATIN CALCIUM 10 MG: 10 | 84 days supply | Qty: 60 | Fill #0

## 2018-09-13 ENCOUNTER — Other Ambulatory Visit: Payer: Self-pay | Admitting: Pharmacist

## 2018-09-13 NOTE — Patient Outreach (Signed)
[facility]) Care Management  09/13/2018  Stacey Short 1946-08-13 [id number]   Contact patient's PCP to confirm that provider is aware that patient is currently taking the rosuvastatin 10 mg alternating 5 mg every other day and 10 mg other days. If provider wishes to continue with this dosing, will request that a new prescription be sent to her pharmacy reflecting this direction. Leave a message on the voicemail of Dr. Silvestre Mesi CMA.  Harlow Asa, PharmD, [place] [facility] [phone]

## 2018-09-13 NOTE — Patient Outreach (Signed)
[facility]) Care Management  09/13/2018  DONNESHA KARG 1946/01/24 [id number]   Receive a voicemail back from [place] [facility] confirming that patient's PCP is aware that patient is currently taking the rosuvastatin 10 mg alternating 5 mg every other day and 10 mg other days and that a new prescription has been sent to her pharmacy reflecting this direction.   Will close pharmacy episode at this time.  Harlow Asa, PharmD, [place] [facility] [phone]

## 2018-09-19 ENCOUNTER — Telehealth: Payer: Self-pay

## 2018-09-19 NOTE — Telephone Encounter (Signed)
We have attempted to call the patient two times to schedule sleep study.  Patient has been unavailable at the phone numbers we have on file and has not returned our calls.  At this point we will send a letter asking patient to please contact the sleep lab to schedule their sleep study.  If patient calls back we will schedule them for their sleep study. 

## 2019-01-15 ENCOUNTER — Other Ambulatory Visit: Payer: Self-pay

## 2019-01-15 ENCOUNTER — Encounter: Payer: PPO | Admitting: Neurology

## 2019-01-15 DIAGNOSIS — G4701 Insomnia due to medical condition: Secondary | ICD-10-CM

## 2019-01-15 DIAGNOSIS — G478 Other sleep disorders: Principal | ICD-10-CM

## 2019-03-15 DIAGNOSIS — M25531 Pain in right wrist: Secondary | ICD-10-CM | POA: Diagnosis not present

## 2019-03-15 DIAGNOSIS — M25831 Other specified joint disorders, right wrist: Secondary | ICD-10-CM | POA: Diagnosis not present

## 2019-03-21 DIAGNOSIS — Z79899 Other long term (current) drug therapy: Secondary | ICD-10-CM | POA: Diagnosis not present

## 2019-03-21 DIAGNOSIS — E7849 Other hyperlipidemia: Secondary | ICD-10-CM | POA: Diagnosis not present

## 2019-03-21 DIAGNOSIS — M810 Age-related osteoporosis without current pathological fracture: Secondary | ICD-10-CM | POA: Diagnosis not present

## 2019-03-23 DIAGNOSIS — R82998 Other abnormal findings in urine: Secondary | ICD-10-CM | POA: Diagnosis not present

## 2019-04-02 DIAGNOSIS — Z0000 Encounter for general adult medical examination without abnormal findings: Secondary | ICD-10-CM | POA: Diagnosis not present

## 2019-04-02 DIAGNOSIS — N6310 Unspecified lump in the right breast, unspecified quadrant: Secondary | ICD-10-CM | POA: Diagnosis not present

## 2019-04-02 DIAGNOSIS — M542 Cervicalgia: Secondary | ICD-10-CM | POA: Diagnosis not present

## 2019-04-02 DIAGNOSIS — M5416 Radiculopathy, lumbar region: Secondary | ICD-10-CM | POA: Diagnosis not present

## 2019-04-02 DIAGNOSIS — M810 Age-related osteoporosis without current pathological fracture: Secondary | ICD-10-CM | POA: Diagnosis not present

## 2019-04-02 DIAGNOSIS — B009 Herpesviral infection, unspecified: Secondary | ICD-10-CM | POA: Diagnosis not present

## 2019-04-02 DIAGNOSIS — G4733 Obstructive sleep apnea (adult) (pediatric): Secondary | ICD-10-CM | POA: Diagnosis not present

## 2019-04-02 DIAGNOSIS — Z780 Asymptomatic menopausal state: Secondary | ICD-10-CM | POA: Diagnosis not present

## 2019-04-02 DIAGNOSIS — Z1331 Encounter for screening for depression: Secondary | ICD-10-CM | POA: Diagnosis not present

## 2019-04-02 DIAGNOSIS — K838 Other specified diseases of biliary tract: Secondary | ICD-10-CM | POA: Diagnosis not present

## 2019-04-02 DIAGNOSIS — G4700 Insomnia, unspecified: Secondary | ICD-10-CM | POA: Diagnosis not present

## 2019-04-02 DIAGNOSIS — E785 Hyperlipidemia, unspecified: Secondary | ICD-10-CM | POA: Diagnosis not present

## 2019-04-03 DIAGNOSIS — M5136 Other intervertebral disc degeneration, lumbar region: Secondary | ICD-10-CM | POA: Diagnosis not present

## 2019-05-02 ENCOUNTER — Other Ambulatory Visit: Payer: Self-pay | Admitting: Internal Medicine

## 2019-05-02 DIAGNOSIS — M5416 Radiculopathy, lumbar region: Secondary | ICD-10-CM

## 2019-05-08 ENCOUNTER — Other Ambulatory Visit: Payer: Self-pay | Admitting: Neurology

## 2019-05-08 DIAGNOSIS — G4733 Obstructive sleep apnea (adult) (pediatric): Secondary | ICD-10-CM

## 2019-05-08 DIAGNOSIS — F5104 Psychophysiologic insomnia: Secondary | ICD-10-CM

## 2019-05-08 DIAGNOSIS — G478 Other sleep disorders: Secondary | ICD-10-CM

## 2019-05-08 DIAGNOSIS — G4701 Insomnia due to medical condition: Secondary | ICD-10-CM

## 2019-05-08 NOTE — Procedures (Signed)
Patient Information     First Name: Stacey Last Name: Short ID: [id number]  Birth Date: [birth year] Age: 73 Gender: Female  Referring Provider: Crist Infante, MD BMI: 24.4 (W=152 lb, H=5' 6'')  Neck Circ.:  15 '' Epworth:  9/24  Sleep Study Information    Study Date: Jul 17-21, 2020 S/H/A Version: [id number] / 4.1.1531 / 87  History:    Mrs. Orren has reported trouble to initiate sleep without medication and to stay asleep without medication. She remembers the onset of these problem after Menopause, at age 49.  Part of her work-up in the past for these long-standing problems have been an attended sleep study that was performed at the [facility] more than 8 years ago.  At the time she was not diagnosed with apnea. She was then discussing with her husband, Dr. Jeanmarie Hubert, what to do next- and he ordered a home sleep test in November 2014 for her.   The HST-respiratory disturbance index was analyzed at an RDI of 21.2/h and after these ambulatory data were stripped of artifact the analyzed sleep time was only 181 minutes (which means the patient slept only 3 hours for sure) her RDI now was 30.5/h - there were periods of very high indices of apnea present which may have reflected REM sleep. At the time her husband had complained about her apnea and habitual loud snoring and he still maintains that observation.            There is moderate severe OSA present at an AHI of 30.0/h and RDI of 30.2/h. The AHI is much higher in presumed REM sleep at AHI of 47.8/h.  This OSA is associated with only minor oxygen desaturation and intermittent bradycardic sinus rhythm.  The OSA is strongly supine sleep position dependent, as demonstrated in the last graph. AHI is 37/h and during other sleep position only about 10/h.    Recommendation: 1) Avoiding the supine sleep position.  2) CPAP is preferable over dental device as the patient is now a loud snorer and apnea is REM dependent.   I  plan to order an auto CPAP device from 7 -17 cm water pressure and 2 cm EPR, heated humidity and an interface of patient's choice.       Physician Name: Larey Seat, MD  05-07-2019    Sleep Summary  Oxygen Saturation Statistics   Start Study Time: End Study Time: Total Recording Time:        11:37:25 PM 7:50:49 AM         8 h, 13 min  Total Sleep Time % REM of Sleep Time:  6 h, 51 min  20.1    Mean: 94 Minimum: 87 Maximum: 100  Mean of Desaturations Nadirs (%):   92  Oxygen Desaturation. %:   4-9 10-20 >20 Total  Events Number Total    88  3 96.7 3.3  0 0.0  91 100.0  Oxygen Saturation: <90 <=88 <85 <80 <70  Duration (minutes): Sleep % 2.6 0.6  0.8 0.0  0.2 0.0 0.0 0.0 0.0 0.0     Respiratory Indices      Total Events REM NREM All Night  pRDI:  201  pAHI:  200 ODI:  91  pAHIc:  16  % CSR: 0.0 47.8 47.8 27.1 3.2 26.1 25.9 10.5 2.2 30.2 30.0 13.7 2.4       Pulse Rate Statistics during Sleep (BPM)      Mean: 74 Minimum: 41  Maximum: 98    Indices are calculated using technically valid sleep time of 6 h, 39 min. pRDI/pAHI are calculated using oxygen desaturations ? 3%  Body Position Statistics  Position Supine Prone Right Left Non-Supine  Sleep (min) 97.0 77.5 121.0 115.5 314.0  Sleep % 23.6 18.9 29.4 28.1 76.4  pRDI 47.5 31.6 20.0 25.7 24.9  pAHI 47.5 30.8 20.0 25.7 24.7  ODI 35.3 10.5 2.0 10.2 7.1     Snoring Statistics Snoring Level (dB) >40 >50 >60 >70 >80 >Threshold (45)  Sleep (min) 63.3 14.7 3.2 0.0 0.0 35.2  Sleep % 15.4 3.6 0.8 0.0 0.0 8.6    Mean: 41 dB Sleep Stages Chart                                              pAHI=30.0                                                          Mild              Moderate                    Severe                                                 5              15                    30

## 2019-05-09 ENCOUNTER — Encounter: Payer: Self-pay | Admitting: Neurology

## 2019-05-09 ENCOUNTER — Telehealth: Payer: Self-pay | Admitting: Neurology

## 2019-05-09 NOTE — Telephone Encounter (Signed)
-----   Message from Larey Seat, MD sent at 05/08/2019  5:14 PM EDT ----- There is moderately- severe OSA present at an AHI of 30.0/h and RDI  of 30.2/h. The AHI is much higher in presumed REM sleep at AHI of  47.8/h.  This OSA is associated with only minor oxygen desaturation and  intermittent bradycardic sinus rhythm.  The OSA is strongly supine sleep position dependent, as  demonstrated in the last graph. AHI is 37/h and during other  sleep position only about 10/h.    Recommendation: 1) Avoiding the supine sleep position.  2) CPAP is preferable over dental device as the patient is not a  loud snorer and apnea is REM dependent.   I plan to order an auto CPAP device from 7 -17 cm water pressure  and 2 cm EPR, heated humidity and an interface of patient's  choice.     Physician Name: Larey Seat, MD 05-07-2019

## 2019-05-09 NOTE — Telephone Encounter (Signed)
Called patient to discuss sleep study results. No answer at this time. LVM for the patient to call back.  Advised I would also send a mychart message. 

## 2019-05-09 NOTE — Telephone Encounter (Signed)
Pt returned call. I advised pt that Dr. Brett Fairy reviewed their sleep study results and found that pt moderate sleep apnea. Dr. Brett Fairy recommends that pt auto CPAP. I reviewed PAP compliance expectations with the pt. Pt is agreeable to starting a CPAP. I advised pt that an order will be sent to a DME, [facility], and [facility] will call the pt within about one week after they file with the pt's insurance. [facility] will show the pt how to use the machine, fit for masks, and troubleshoot the CPAP if needed. A follow up appt was made for insurance purposes with Bobbie Stack on Oct 8,2020 at 10 am. Pt verbalized understanding to arrive 15 minutes early and bring their CPAP. A letter with all of this information in it will be mailed to the pt as a reminder. I verified with the pt that the address we have on file is correct. Pt verbalized understanding of results. Pt had no questions at this time but was encouraged to call back if questions arise. I have sent the order to [facility] and have received confirmation that they have received the order.

## 2019-05-21 DIAGNOSIS — G4733 Obstructive sleep apnea (adult) (pediatric): Secondary | ICD-10-CM | POA: Diagnosis not present

## 2019-05-28 ENCOUNTER — Ambulatory Visit
Admission: RE | Admit: 2019-05-28 | Discharge: 2019-05-28 | Disposition: A | Discharge status: Home / Self Care | Payer: PPO | Source: Ambulatory Visit | Attending: Internal Medicine | Admitting: Internal Medicine

## 2019-05-28 DIAGNOSIS — M48061 Spinal stenosis, lumbar region without neurogenic claudication: Secondary | ICD-10-CM | POA: Diagnosis not present

## 2019-05-28 DIAGNOSIS — M5416 Radiculopathy, lumbar region: Secondary | ICD-10-CM

## 2019-05-31 ENCOUNTER — Telehealth: Payer: Self-pay | Admitting: Neurology

## 2019-05-31 ENCOUNTER — Telehealth: Payer: Self-pay

## 2019-05-31 DIAGNOSIS — G4733 Obstructive sleep apnea (adult) (pediatric): Secondary | ICD-10-CM

## 2019-05-31 NOTE — Telephone Encounter (Signed)
Called the patient back. She states that she does ok with falling asleep. She states that the machine will ramp up to 7 cm, but everytime it tends to hit around the 12 cm water pressure mark it wakes her up. She states the masks makes this horrible noise almost like to much air coming out and then its hard for her to fall back asleep.advised the patient I will have Dr Dohmeier review the data from the 5 days she has used the machine and that I will see what she recommends and call her back. Pt verbalized understanding.

## 2019-05-31 NOTE — Telephone Encounter (Signed)
error 

## 2019-05-31 NOTE — Telephone Encounter (Signed)
Pt is asking for  A call to discuss the pressure settings on her CPAP please call

## 2019-05-31 NOTE — Addendum Note (Signed)
Addended by: Darleen Crocker on: 05/31/2019 04:52 PM   Modules accepted: Orders

## 2019-06-21 DIAGNOSIS — G4733 Obstructive sleep apnea (adult) (pediatric): Secondary | ICD-10-CM | POA: Diagnosis not present

## 2019-06-27 DIAGNOSIS — M545 Low back pain: Secondary | ICD-10-CM | POA: Diagnosis not present

## 2019-07-09 DIAGNOSIS — M545 Low back pain: Secondary | ICD-10-CM | POA: Diagnosis not present

## 2019-07-12 DIAGNOSIS — Z23 Encounter for immunization: Secondary | ICD-10-CM | POA: Diagnosis not present

## 2019-07-18 DIAGNOSIS — M5136 Other intervertebral disc degeneration, lumbar region: Secondary | ICD-10-CM | POA: Diagnosis not present

## 2019-07-19 ENCOUNTER — Ambulatory Visit: Payer: Self-pay | Admitting: Adult Health

## 2019-07-19 ENCOUNTER — Encounter: Payer: Self-pay | Admitting: Adult Health

## 2019-07-21 DIAGNOSIS — G4733 Obstructive sleep apnea (adult) (pediatric): Secondary | ICD-10-CM | POA: Diagnosis not present

## 2019-07-25 DIAGNOSIS — M545 Low back pain: Secondary | ICD-10-CM | POA: Diagnosis not present

## 2019-07-30 DIAGNOSIS — M5116 Intervertebral disc disorders with radiculopathy, lumbar region: Secondary | ICD-10-CM | POA: Diagnosis not present

## 2019-07-30 DIAGNOSIS — M5416 Radiculopathy, lumbar region: Secondary | ICD-10-CM | POA: Diagnosis not present

## 2019-08-20 DIAGNOSIS — G4733 Obstructive sleep apnea (adult) (pediatric): Secondary | ICD-10-CM | POA: Insufficient documentation

## 2019-08-20 NOTE — Progress Notes (Signed)
PATIENT: Stacey Short DOB: May 18, 1946  REASON FOR VISIT: follow up HISTORY FROM: patient  Chief Complaint  Patient presents with  . Follow-up    Initial cpap f/u. Alone. Rm 1. Patient mentioned that she just got a new mask this morning,      HISTORY OF PRESENT ILLNESS: Today 08/21/19 Stacey Short is a 73 y.o. female here today for follow up for moderate OSA on CPAP. She was initially seen in 07/2018 for concerns of insomnia and sleep apnea. Home sleep study in 04/2019 revealed moderate sleep apnea with AHI of 30 and REM AHI of 47.8.  She is adjusting to CPAP therapy.  She has had some difficulty with the full facemask.  She feels that this mask leaks air.  She has picked up a nasal pillow today from her DME company.  She plans to start this tonight.  She feels capable of adjusting mask and headgear.  She is aware of how to check for leak with her CPAP machine.  She continues to have trouble with insomnia.  She states that she can go to sleep without difficulty but then wakes within 2 to 3 hours.  Sometimes she will take 5 mg of Ambien but states that effectiveness is inconsistent.  She has never taken trazodone that she can remember.  She did not do well with Belsomra.  She has not taken Xanax in quite some time.  She does report using her husband's travel CPAP when [place] the [place].  Compliance report dated 07/21/2019 through 08/19/2019 reveals that she is using CPAP 25 of the last 30 days for compliance of 83%.  She used CPAP greater than 4 hours 15 of the last 30 days for compliance of 50%.  Average usage was about 3 hours and 52 minutes.  AHI was 2.8 on 7 to 14 cm of water and an EPR of 3.  There was a significant leak noted in the 95th percentile of 38.1.   History (copied from Dr Dohmeier's note on 08/10/2018)  HPI:  Stacey Short is a 73 y.o. female patient, seen here on 08-10-2018 in a referral from Dr. Joylene Draft for new sleep evaluation,   Stacey Short has had trouble  to initiate sleep without medication and to stay asleep without medication. She remembers the onset of these problem after menopause , at age 89.   Part of her work-up in the past for these long-standing problems have been a attended sleep study that was performed at the [facility].  The patient estimates that it may have been more than 8 years ago.  At the time based on these attended sleep study results she was not diagnosed with apnea. She was then discussing with her husband, Dr. Jeanmarie Hubert, what to do next- and he ordered a home sleep test in November 2014 for her.   At the time her husband had complained about her apnea and habitual loud snoring and he still maintains that observation.  The HST-respiratory disturbance index was analyzed at an RDI of 21.2/h and after these ambulatory data were stripped of artifact the analyzed sleep time was only 181 minutes ( which means the patient slept only 3 hours for sure) her RDI now was 30.5/h - there were periods of very high indices of apnea present which may have reflected REM sleep.  Treatment options were discussed with my colleague Star Age 5 years ago who stated that these mild forms of apnea and  given her overbite and retrognathia may be more amendable to a dental device, she also should avoid sleeping on her back.  She also commented that weight loss would not be offering a valid treatment option in a patient that already has a BMI of 25 and works out three times a week.   Chief complaint according to patient : " I don't think that I have sleep apnea" - "I need pills to sleep"- and " I take Belsomra and little bites of Ambien "   Sleep habits are as follows:  Dinner time is usually around 7.30 Pm, and bedtime is around 11-midnight, the bedroom is cool, quiet , and  dark without electronics or noise infiltrating. She sleeps on her side, shares the bed with her husband,  in a [place] size bed. With Belsomra on board she is  asleep in 15 minutes, and she wakes 2.5 hours later, has to go the bathroom. She then takes a tiny bite of the Ambien tablet and sleeps for another 4-5 hours. Feels rested in AM, no headaches, sometimes a stiff neck, no dry mouth, no palpitations. She dreams, but does not recall most dreams- and feels these are pleasant , not anxiety provoking. She wake sup spontaously  at 6.30 AM, gets her cup of coffee and returns to bed to read for another 30 minutes- she feels [name] is the best sleep a she had in a long time.    Sleep medical history and family sleep history: insomnia since menopause, at age 28.  Mother was a light sleeper.    Social history: married to physician,  Two biological adult children, 20 and 50 years old, daughter is a PhD. Non  Smoker - quit in 1984, ETOH - daily having wine with dinner, one cocktail on weekend. Caffeine - uses ice tea when eating out at lunch -all decaffeinated beverages.    REVIEW OF SYSTEMS: Out of a complete 14 system review of symptoms, the patient complains only of the following symptoms, insomnia and all other reviewed systems are negative.  Epworth sleepiness scale: 5  ALLERGIES: Allergies  Allergen Reactions  . Celecoxib     REACTION: swelling  . Codeine     REACTION: nausea  . Ultram [Tramadol] Nausea And Vomiting    HOME MEDICATIONS: Outpatient Medications Prior to Visit  Medication Sig Dispense Refill  . estradiol (ESTRACE) 0.1 MG/GM vaginal cream Place 1 Applicatorful vaginally 2 (two) times a week.    . estradiol (VIVELLE-DOT) 0.025 MG/24HR Place 1 patch onto the skin 2 (two) times a week.    . Multiple Vitamins-Minerals (MULTIVITAMIN PO) Take 1 tablet by mouth daily.     . Omega-3 Fatty Acids (FISH OIL PO) Take by mouth.    . RABEprazole (ACIPHEX) 20 MG tablet Take 20 mg by mouth daily as needed (ulcer).    . rosuvastatin (CRESTOR) 10 MG tablet Take 5-10 mg by mouth daily with breakfast. Take 5 mg every other day, and 10 mg other days.     Marland Kitchen testosterone (ANDROGEL) 50 MG/5GM GEL 5 g 2 (two) times a week.    Marland Kitchen VITAMIN D, CHOLECALCIFEROL, PO Take 1 tablet by mouth daily.    Marland Kitchen zolpidem (AMBIEN) 10 MG tablet Take 5 mg by mouth at bedtime as needed.    . Suvorexant (BELSOMRA) 20 MG TABS Take 1 tablet by mouth at bedtime.    . ALPRAZolam (XANAX) 1 MG tablet Take 1 tablet (1 mg total) by mouth at bedtime as needed for sleep. (  Patient not taking: Reported on 08/10/2018) 60 tablet 3  . HYDROmorphone (DILAUDID) 2 MG tablet Take 1-2 tablets (2-4 mg total) by mouth every 4 (four) hours as needed for severe pain. (Patient not taking: Reported on 08/10/2018) 40 tablet 0  . methocarbamol (ROBAXIN) 500 MG tablet Take 1 tablet (500 mg total) by mouth 4 (four) times daily. As needed for muscle spasm 30 tablet 1   No facility-administered medications prior to visit.     PAST MEDICAL HISTORY: Past Medical History:  Diagnosis Date  . COLONIC POLYPS, HYPERPLASTIC, HX OF   . H/O gastritis    egd 2009  ,h.pylori negative  . Hyperlipidemia   . Unspecified hemorrhoids without mention of complication     PAST SURGICAL HISTORY: Past Surgical History:  Procedure Laterality Date  . BREAST REDUCTION SURGERY    . EXCISION/RELEASE BURSA HIP Right 09/26/2013   Procedure: RIGHT GLUTEAL BURSECTOMY, GLUTEAL TENDON DEBRIDEMENT ;  Surgeon: Gearlean Alf, MD;  Location: [facility];  Service: Orthopedics;  Laterality: Right;  . PARTIAL HYSTERECTOMY  1987  . Thumb joint repair    . TUMMY TUCK      FAMILY HISTORY: Family History  Problem Relation Age of Onset  . Kidney cancer Sister   . Lymphoma Father   . Diabetes Mother   . Heart disease Mother   . Diabetes Sister   . Lupus Sister   . Colon cancer Neg Hx     SOCIAL HISTORY: Social History   Socioeconomic History  . Marital status: Married    Spouse name: Not on file  . Number of children: 5  . Years of education: Not on file  . Highest education level: Master's degree (e.g., MA, MS, MEng,  MEd, MSW, MBA)  Occupational History    Comment: retired  Scientific laboratory technician  . Financial resource strain: Not on file  . Food insecurity    Worry: Not on file    Inability: Not on file  . Transportation needs    Medical: Not on file    Non-medical: Not on file  Tobacco Use  . Smoking status: Former Smoker    Quit date: 10/11/1982    Years since quitting: 36.8  . Smokeless tobacco: Never Used  Substance and Sexual Activity  . Alcohol use: Yes    Comment: 2 glasses wine daily  . Drug use: No  . Sexual activity: Not on file  Lifestyle  . Physical activity    Days per week: Not on file    Minutes per session: Not on file  . Stress: Not on file  Relationships  . Social Herbalist on phone: Not on file    Gets together: Not on file    Attends religious service: Not on file    Active member of club or organization: Not on file    Attends meetings of clubs or organizations: Not on file    Relationship status: Not on file  . Intimate partner violence    Fear of current or ex partner: Not on file    Emotionally abused: Not on file    Physically abused: Not on file    Forced sexual activity: Not on file  Other Topics Concern  . Not on file  Social History Narrative   Lives with husband   Caffeine- very little    PHYSICAL EXAM  Vitals:   08/21/19 1038  BP: 113/68  Pulse: 78  Temp: 97.9 F (36.6 C)  TempSrc: Oral  Weight:  148 lb 9.6 oz (67.4 kg)  Height: 5' 5.5" (1.664 m)   Body mass index is 24.35 kg/m.  Generalized: Well developed, in no acute distress  Cardiology: normal rate and rhythm, no murmur noted Respiratory: Clear to auscultation bilaterally Neurological examination  Mentation: Alert oriented to time, place, history taking. Follows all commands speech and language fluent Cranial nerve II-XII: Pupils were equal round reactive to light. Extraocular movements were full, visual field were full on confrontational test.  Motor: The motor testing reveals 5  over 5 strength of all 4 extremities. Good symmetric motor tone is noted throughout.  Gait and station: Gait is normal.   DIAGNOSTIC DATA (LABS, IMAGING, TESTING) - I reviewed patient records, labs, notes, testing and imaging myself where available.  No flowsheet data found.   Lab Results  Component Value Date   WBC 4.8 09/24/2013   HGB 13.8 09/24/2013   HCT 40.7 09/24/2013   MCV 94.9 09/24/2013   PLT 190 09/24/2013      Component Value Date/Time   NA 137 03/01/2014 1445   K 3.9 03/01/2014 1445   CL 100 03/01/2014 1445   CO2 25 03/01/2014 1445   GLUCOSE 94 03/01/2014 1445   BUN 16 03/01/2014 1445   CREATININE 0.80 03/01/2014 1445   CALCIUM 9.1 03/01/2014 1445   PROT 7.3 12/12/2012 1026   ALBUMIN 4.1 12/12/2012 1026   AST 16 12/12/2012 1026   ALT 17 12/12/2012 1026   ALKPHOS 45 12/12/2012 1026   BILITOT 0.8 12/12/2012 1026   GFRNONAA 75 (L) 03/01/2014 1445   GFRAA 87 (L) 03/01/2014 1445   No results found for: CHOL, HDL, LDLCALC, LDLDIRECT, TRIG, CHOLHDL No results found for: HGBA1C No results found for: VITAMINB12 No results found for: TSH   ASSESSMENT AND PLAN 73 y.o. year old female  has a past medical history of COLONIC POLYPS, HYPERPLASTIC, HX OF, H/O gastritis, Hyperlipidemia, and Unspecified hemorrhoids without mention of complication. here with     ICD-10-CM   1. 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  She is adjusting to CPAP therapy.  She has identified difficulty with a leak and has gotten a new mask to try at home.  She will start nasal pillow tonight.  She feels capable of addressing any concerns of a leak with her DME provider.  She does note some improvement in sleep but continues to suffer with insomnia.  Compliance report reveals average usage of about 3 hours and 52 minutes.  Ambien does not seem to be effective.  I have called and trazodone 50 mg.  She was advised on  potential side effects of this medication.  She will take this medication every night and assess effectiveness in 6 to 8 weeks.  We may increase dose if needed.  She was encouraged to continue using CPAP nightly and for greater than 4 hours each night.  She will continue close follow-up with primary care.  She will follow-up with me in 6 months, sooner if needed.  She verbalizes understanding and agreement with this plan.   No orders of the defined types were placed in this encounter.    Meds ordered this encounter  Medications  . traZODone (DESYREL) 50 MG tablet    Sig: Take 1 tablet (50 mg total) by mouth at bedtime.    Dispense:  90 tablet    Refill:  3    Order Specific Question:   Supervising Provider    Answer:   Melvenia Beam [id number], FNP-C 08/21/2019, 1:34 PM [facility] [address], [place], [place] [address] [phone]

## 2019-08-21 ENCOUNTER — Other Ambulatory Visit: Payer: Self-pay

## 2019-08-21 ENCOUNTER — Encounter: Payer: Self-pay | Admitting: Family Medicine

## 2019-08-21 ENCOUNTER — Ambulatory Visit (INDEPENDENT_AMBULATORY_CARE_PROVIDER_SITE_OTHER): Payer: PPO | Admitting: Family Medicine

## 2019-08-21 VITALS — BP 113/68 | HR 78 | Temp 97.9°F | Ht 65.5 in | Wt 148.6 lb

## 2019-08-21 DIAGNOSIS — Z9989 Dependence on other enabling machines and devices: Secondary | ICD-10-CM

## 2019-08-21 DIAGNOSIS — G4733 Obstructive sleep apnea (adult) (pediatric): Secondary | ICD-10-CM | POA: Diagnosis not present

## 2019-08-21 DIAGNOSIS — G4701 Insomnia due to medical condition: Secondary | ICD-10-CM | POA: Diagnosis not present

## 2019-08-21 MED ORDER — TRAZODONE HCL 50 MG PO TABS: 50.0000 mg | ORAL_TABLET | Freq: Every day | ORAL | 3 refills | Status: DC

## 2019-08-21 NOTE — Patient Instructions (Signed)
Continue CPAP nightly and for greater than 4 hours each night. Please make sure mask is snug and well fitting  Follow up in 6 months, sooner if needed    Sleep Apnea Sleep apnea affects breathing during sleep. It causes breathing to stop for a short time or to become shallow. It can also increase the risk of:  Heart attack.  Stroke.  Being very overweight (obese).  Diabetes.  Heart failure.  Irregular heartbeat. The goal of treatment is to help you breathe normally again. What are the causes? There are three kinds of sleep apnea:  Obstructive sleep apnea. This is caused by a blocked or collapsed airway.  Central sleep apnea. This happens when the brain does not send the right signals to the muscles that control breathing.  Mixed sleep apnea. This is a combination of obstructive and central sleep apnea. The most common cause of this condition is a collapsed or blocked airway. This can happen if:  Your throat muscles are too relaxed.  Your tongue and tonsils are too large.  You are overweight.  Your airway is too small. What increases the risk?  Being overweight.  Smoking.  Having a small airway.  Being older.  Being female.  Drinking alcohol.  Taking medicines to calm yourself (sedatives or tranquilizers).  Having family members with the condition. What are the signs or symptoms?  Trouble staying asleep.  Being sleepy or tired during the day.  Getting angry a lot.  Loud snoring.  Headaches in the morning.  Not being able to focus your mind (concentrate).  Forgetting things.  Less interest in sex.  Mood swings.  Personality changes.  Feelings of sadness (depression).  Waking up a lot during the night to pee (urinate).  Dry mouth.  Sore throat. How is this diagnosed?  Your medical history.  A physical exam.  A test that is done when you are sleeping (sleep study). The test is most often done in a sleep lab but may also be done at  home. How is this treated?   Sleeping on your side.  Using a medicine to get rid of mucus in your nose (decongestant).  Avoiding the use of alcohol, medicines to help you relax, or certain pain medicines (narcotics).  Losing weight, if needed.  Changing your diet.  Not smoking.  Using a machine to open your airway while you sleep, such as: ? An oral appliance. This is a mouthpiece that shifts your lower jaw forward. ? A CPAP device. This device blows air through a mask when you breathe out (exhale). ? An EPAP device. This has valves that you put in each nostril. ? A BPAP device. This device blows air through a mask when you breathe in (inhale) and breathe out.  Having surgery if other treatments do not work. It is important to get treatment for sleep apnea. Without treatment, it can lead to:  High blood pressure.  Coronary artery disease.  In men, not being able to have an erection (impotence).  Reduced thinking ability. Follow these instructions at home: Lifestyle  Make changes that your doctor recommends.  Eat a healthy diet.  Lose weight if needed.  Avoid alcohol, medicines to help you relax, and some pain medicines.  Do not use any products that contain nicotine or tobacco, such as cigarettes, e-cigarettes, and chewing tobacco. If you need help quitting, ask your doctor. General instructions  Take over-the-counter and prescription medicines only as told by your doctor.  If you were given  a machine to use while you sleep, use it only as told by your doctor.  If you are having surgery, make sure to tell your doctor you have sleep apnea. You may need to bring your device with you.  Keep all follow-up visits as told by your doctor. This is important. Contact a doctor if:  The machine that you were given to use during sleep bothers you or does not seem to be working.  You do not get better.  You get worse. Get help right away if:  Your chest hurts.  You  have trouble breathing in enough air.  You have an uncomfortable feeling in your back, arms, or stomach.  You have trouble talking.  One side of your body feels weak.  A part of your face is hanging down. These symptoms may be an emergency. Do not wait to see if the symptoms will go away. Get medical help right away. Call your local emergency services (911 in the [place] Do not drive yourself to the hospital. Summary  This condition affects breathing during sleep.  The most common cause is a collapsed or blocked airway.  The goal of treatment is to help you breathe normally while you sleep. This information is not intended to replace advice given to you by your health care provider. Make sure you discuss any questions you have with your health care provider. Document Released: 07/06/2008 Document Revised: 07/14/2018 Document Reviewed: 05/23/2018 Elsevier Patient Education  2020 [employer].

## 2019-09-19 DIAGNOSIS — G4733 Obstructive sleep apnea (adult) (pediatric): Secondary | ICD-10-CM | POA: Diagnosis not present

## 2019-09-19 DIAGNOSIS — G500 Trigeminal neuralgia: Secondary | ICD-10-CM | POA: Diagnosis not present

## 2019-09-19 DIAGNOSIS — E785 Hyperlipidemia, unspecified: Secondary | ICD-10-CM | POA: Diagnosis not present

## 2019-09-19 DIAGNOSIS — R519 Headache, unspecified: Secondary | ICD-10-CM | POA: Diagnosis not present

## 2019-09-19 DIAGNOSIS — Z780 Asymptomatic menopausal state: Secondary | ICD-10-CM | POA: Diagnosis not present

## 2019-09-19 DIAGNOSIS — N952 Postmenopausal atrophic vaginitis: Secondary | ICD-10-CM | POA: Diagnosis not present

## 2019-09-19 DIAGNOSIS — M810 Age-related osteoporosis without current pathological fracture: Secondary | ICD-10-CM | POA: Diagnosis not present

## 2019-09-19 DIAGNOSIS — M542 Cervicalgia: Secondary | ICD-10-CM | POA: Diagnosis not present

## 2019-09-19 DIAGNOSIS — G4700 Insomnia, unspecified: Secondary | ICD-10-CM | POA: Diagnosis not present

## 2019-09-19 DIAGNOSIS — M48061 Spinal stenosis, lumbar region without neurogenic claudication: Secondary | ICD-10-CM | POA: Diagnosis not present

## 2019-09-20 DIAGNOSIS — G4733 Obstructive sleep apnea (adult) (pediatric): Secondary | ICD-10-CM | POA: Diagnosis not present

## 2019-10-21 DIAGNOSIS — G4733 Obstructive sleep apnea (adult) (pediatric): Secondary | ICD-10-CM | POA: Diagnosis not present

## 2019-10-30 DIAGNOSIS — M5416 Radiculopathy, lumbar region: Secondary | ICD-10-CM | POA: Diagnosis not present

## 2019-10-30 DIAGNOSIS — M5116 Intervertebral disc disorders with radiculopathy, lumbar region: Secondary | ICD-10-CM | POA: Diagnosis not present

## 2019-11-21 DIAGNOSIS — G4733 Obstructive sleep apnea (adult) (pediatric): Secondary | ICD-10-CM | POA: Diagnosis not present

## 2019-12-03 ENCOUNTER — Other Ambulatory Visit: Payer: Self-pay | Admitting: Internal Medicine

## 2019-12-03 DIAGNOSIS — Z1231 Encounter for screening mammogram for malignant neoplasm of breast: Secondary | ICD-10-CM

## 2019-12-10 ENCOUNTER — Telehealth: Payer: Self-pay | Admitting: Neurology

## 2019-12-10 DIAGNOSIS — G4733 Obstructive sleep apnea (adult) (pediatric): Secondary | ICD-10-CM

## 2019-12-10 NOTE — Telephone Encounter (Signed)
Pt called wanting to know if her cpap machine can be changed back to a 17 from a 14. Also pt states that [facility] will be faxing over a request to a prescription for a travel cpap. Please advise.

## 2019-12-11 NOTE — Telephone Encounter (Signed)
Travel CPAP order placed for the patient and sent to the fax number that was provided.

## 2019-12-11 NOTE — Telephone Encounter (Signed)
Called the patient back and she states that she has been traveling and using a different CPAP machine. I advised that with this machine in the 2 days of data I can see, her pressure is only reaching up to 11. I advised that we should keep the setting where it is. Patient is also requesting a order for a travel CPAP machine is sent to [email or web address]. she provided the fax number [phone]. Advised I would send that order for her.

## 2019-12-13 DIAGNOSIS — G500 Trigeminal neuralgia: Secondary | ICD-10-CM | POA: Diagnosis not present

## 2019-12-13 DIAGNOSIS — M48061 Spinal stenosis, lumbar region without neurogenic claudication: Secondary | ICD-10-CM | POA: Diagnosis not present

## 2019-12-13 DIAGNOSIS — R519 Headache, unspecified: Secondary | ICD-10-CM | POA: Diagnosis not present

## 2019-12-19 DIAGNOSIS — G4733 Obstructive sleep apnea (adult) (pediatric): Secondary | ICD-10-CM | POA: Diagnosis not present

## 2019-12-20 ENCOUNTER — Other Ambulatory Visit: Payer: Self-pay | Admitting: Internal Medicine

## 2019-12-20 DIAGNOSIS — G500 Trigeminal neuralgia: Secondary | ICD-10-CM

## 2019-12-31 DIAGNOSIS — G4733 Obstructive sleep apnea (adult) (pediatric): Secondary | ICD-10-CM | POA: Diagnosis not present

## 2020-01-15 ENCOUNTER — Ambulatory Visit
Admission: RE | Admit: 2020-01-15 | Discharge: 2020-01-15 | Disposition: A | Discharge status: Home / Self Care | Payer: PPO | Source: Ambulatory Visit | Attending: Internal Medicine | Admitting: Internal Medicine

## 2020-01-15 ENCOUNTER — Other Ambulatory Visit: Payer: Self-pay

## 2020-01-15 DIAGNOSIS — G500 Trigeminal neuralgia: Secondary | ICD-10-CM | POA: Diagnosis not present

## 2020-01-15 MED ORDER — GADOBENATE DIMEGLUMINE 529 MG/ML IV SOLN
15.0000 mL | Freq: Once | INTRAVENOUS | Status: AC | PRN
  Administered 2020-01-15: 17:00:00 15 mL via INTRAVENOUS

## 2020-01-16 ENCOUNTER — Ambulatory Visit: Payer: PPO

## 2020-01-19 DIAGNOSIS — G4733 Obstructive sleep apnea (adult) (pediatric): Secondary | ICD-10-CM | POA: Diagnosis not present

## 2020-01-28 ENCOUNTER — Other Ambulatory Visit: Payer: Self-pay

## 2020-01-28 ENCOUNTER — Ambulatory Visit
Admission: RE | Admit: 2020-01-28 | Discharge: 2020-01-28 | Disposition: A | Discharge status: Home / Self Care | Payer: PPO | Source: Ambulatory Visit | Attending: Internal Medicine | Admitting: Internal Medicine

## 2020-01-28 DIAGNOSIS — Z1231 Encounter for screening mammogram for malignant neoplasm of breast: Secondary | ICD-10-CM

## 2020-01-29 DIAGNOSIS — M5116 Intervertebral disc disorders with radiculopathy, lumbar region: Secondary | ICD-10-CM | POA: Diagnosis not present

## 2020-01-29 DIAGNOSIS — M4125 Other idiopathic scoliosis, thoracolumbar region: Secondary | ICD-10-CM | POA: Diagnosis not present

## 2020-01-29 DIAGNOSIS — M5416 Radiculopathy, lumbar region: Secondary | ICD-10-CM | POA: Diagnosis not present

## 2020-02-18 DIAGNOSIS — G4733 Obstructive sleep apnea (adult) (pediatric): Secondary | ICD-10-CM | POA: Diagnosis not present

## 2020-02-19 ENCOUNTER — Ambulatory Visit: Payer: PPO | Admitting: Family Medicine

## 2020-03-20 DIAGNOSIS — G4733 Obstructive sleep apnea (adult) (pediatric): Secondary | ICD-10-CM | POA: Diagnosis not present

## 2020-04-03 DIAGNOSIS — M810 Age-related osteoporosis without current pathological fracture: Secondary | ICD-10-CM | POA: Diagnosis not present

## 2020-04-03 DIAGNOSIS — E7849 Other hyperlipidemia: Secondary | ICD-10-CM | POA: Diagnosis not present

## 2020-04-16 ENCOUNTER — Ambulatory Visit: Payer: PPO | Admitting: Family Medicine

## 2020-04-19 DIAGNOSIS — G4733 Obstructive sleep apnea (adult) (pediatric): Secondary | ICD-10-CM | POA: Diagnosis not present

## 2020-04-21 DIAGNOSIS — Z1331 Encounter for screening for depression: Secondary | ICD-10-CM | POA: Diagnosis not present

## 2020-04-21 DIAGNOSIS — G500 Trigeminal neuralgia: Secondary | ICD-10-CM | POA: Diagnosis not present

## 2020-04-21 DIAGNOSIS — M545 Low back pain: Secondary | ICD-10-CM | POA: Diagnosis not present

## 2020-04-21 DIAGNOSIS — G4733 Obstructive sleep apnea (adult) (pediatric): Secondary | ICD-10-CM | POA: Diagnosis not present

## 2020-04-21 DIAGNOSIS — M48061 Spinal stenosis, lumbar region without neurogenic claudication: Secondary | ICD-10-CM | POA: Diagnosis not present

## 2020-04-21 DIAGNOSIS — G4700 Insomnia, unspecified: Secondary | ICD-10-CM | POA: Diagnosis not present

## 2020-04-21 DIAGNOSIS — Z1212 Encounter for screening for malignant neoplasm of rectum: Secondary | ICD-10-CM | POA: Diagnosis not present

## 2020-04-21 DIAGNOSIS — Z0000 Encounter for general adult medical examination without abnormal findings: Secondary | ICD-10-CM | POA: Diagnosis not present

## 2020-04-21 DIAGNOSIS — E785 Hyperlipidemia, unspecified: Secondary | ICD-10-CM | POA: Diagnosis not present

## 2020-04-21 DIAGNOSIS — M810 Age-related osteoporosis without current pathological fracture: Secondary | ICD-10-CM | POA: Diagnosis not present

## 2020-04-21 DIAGNOSIS — K838 Other specified diseases of biliary tract: Secondary | ICD-10-CM | POA: Diagnosis not present

## 2020-04-21 DIAGNOSIS — R82998 Other abnormal findings in urine: Secondary | ICD-10-CM | POA: Diagnosis not present

## 2020-04-21 DIAGNOSIS — M5416 Radiculopathy, lumbar region: Secondary | ICD-10-CM | POA: Diagnosis not present

## 2020-04-21 DIAGNOSIS — R519 Headache, unspecified: Secondary | ICD-10-CM | POA: Diagnosis not present

## 2020-04-22 DIAGNOSIS — M5416 Radiculopathy, lumbar region: Secondary | ICD-10-CM | POA: Diagnosis not present

## 2020-04-22 DIAGNOSIS — M5116 Intervertebral disc disorders with radiculopathy, lumbar region: Secondary | ICD-10-CM | POA: Diagnosis not present

## 2020-04-24 ENCOUNTER — Other Ambulatory Visit: Payer: Self-pay | Admitting: Internal Medicine

## 2020-04-24 DIAGNOSIS — E785 Hyperlipidemia, unspecified: Secondary | ICD-10-CM

## 2020-05-09 ENCOUNTER — Ambulatory Visit
Admission: RE | Admit: 2020-05-09 | Discharge: 2020-05-09 | Disposition: A | Discharge status: Home / Self Care | Payer: No Typology Code available for payment source | Source: Ambulatory Visit | Attending: Internal Medicine | Admitting: Internal Medicine

## 2020-05-09 DIAGNOSIS — E785 Hyperlipidemia, unspecified: Secondary | ICD-10-CM

## 2020-05-20 DIAGNOSIS — G4733 Obstructive sleep apnea (adult) (pediatric): Secondary | ICD-10-CM | POA: Diagnosis not present

## 2020-05-28 DIAGNOSIS — Z20822 Contact with and (suspected) exposure to covid-19: Secondary | ICD-10-CM | POA: Diagnosis not present

## 2020-05-29 DIAGNOSIS — Z20822 Contact with and (suspected) exposure to covid-19: Secondary | ICD-10-CM | POA: Diagnosis not present

## 2020-06-03 DIAGNOSIS — R69 Illness, unspecified: Secondary | ICD-10-CM | POA: Diagnosis not present

## 2020-06-03 DIAGNOSIS — K359 Acute appendicitis, unspecified: Secondary | ICD-10-CM | POA: Diagnosis not present

## 2020-06-10 IMAGING — MR MRI LUMBAR SPINE WITHOUT CONTRAST
4 of 5 series · 24 of 48 positions shown · non-contrast
Comparison: 11/30/2017

CLINICAL DATA: Low back pain with radiculopathy

EXAM:
MRI LUMBAR SPINE WITHOUT CONTRAST
TECHNIQUE: Multiplanar, multisequence MR imaging of the lumbar spine was
performed. No intravenous contrast was administered.

[Series 2: T2 · sagittal · 4.0mm · 0.55mm/px · 6 of 16 slices shown (1 of 2)]
[im 1/16]
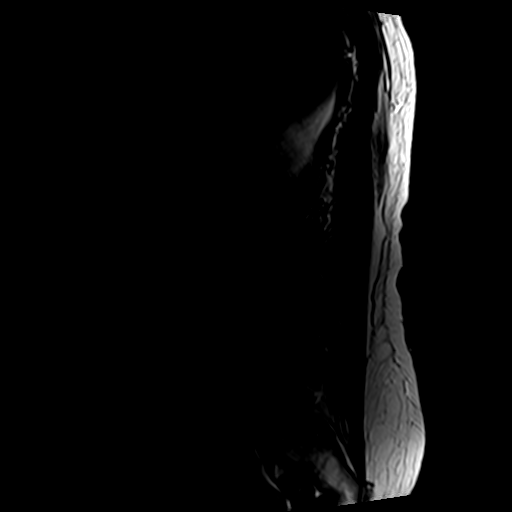
[im 4/16]
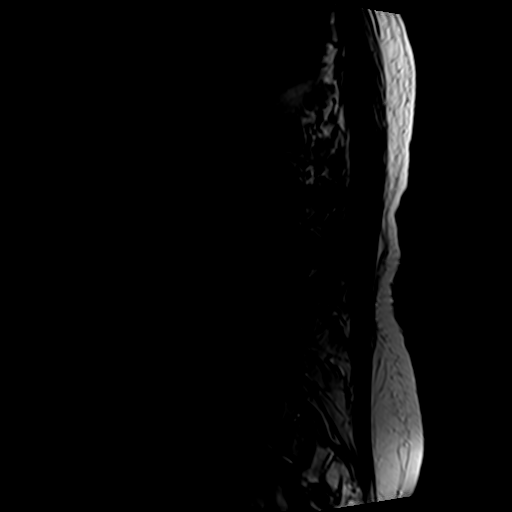
[im 7/16]
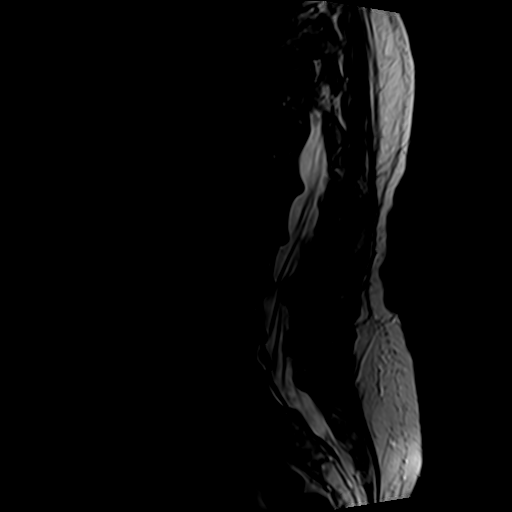
[im 10/16]
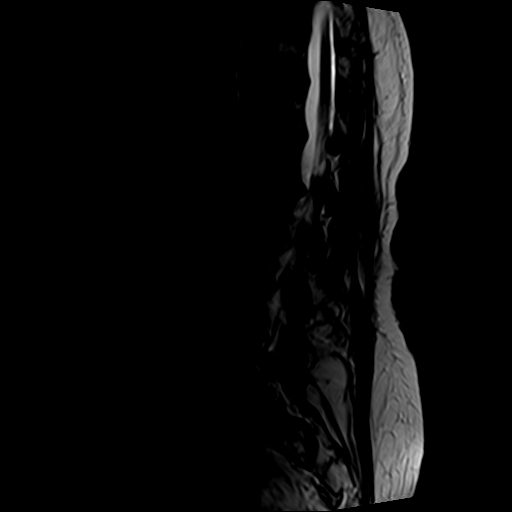
[im 13/16]
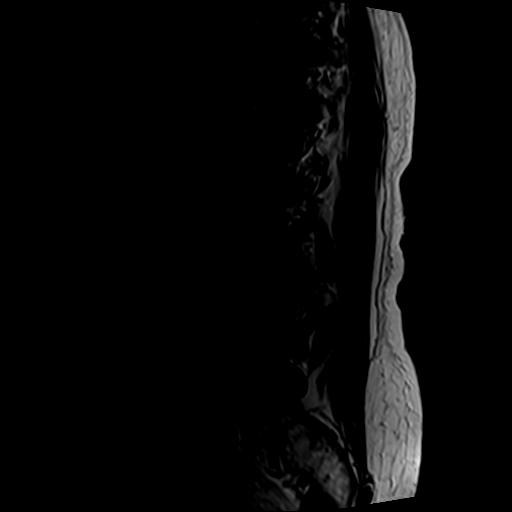
[im 16/16]
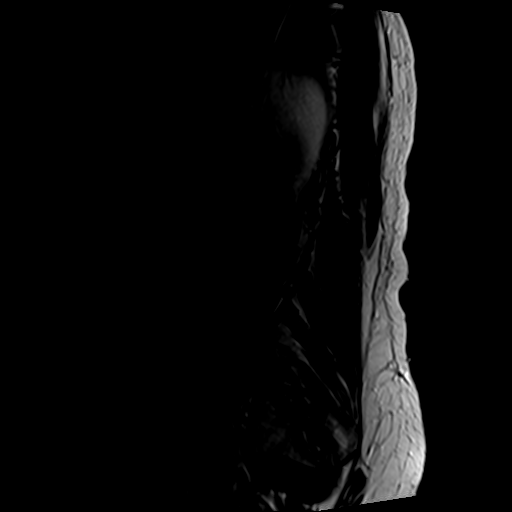

[Series 4: T1 · sagittal · 4.0mm · 0.55mm/px · 7 of 16 slices shown (1 of 2)]
[im 1/16]
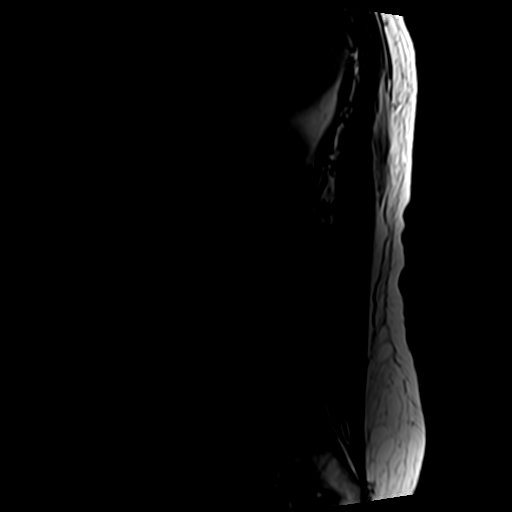
[im 3/16]
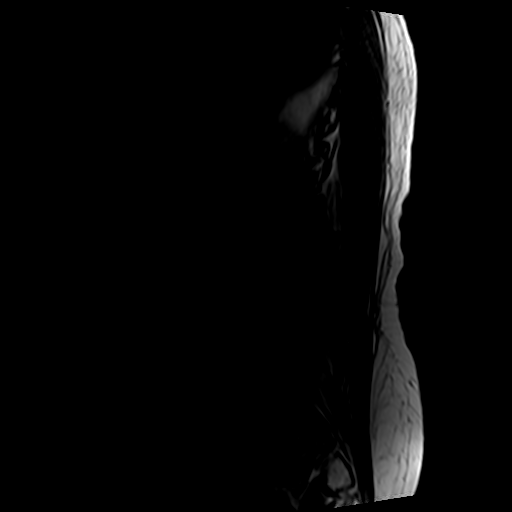
[im 6/16]
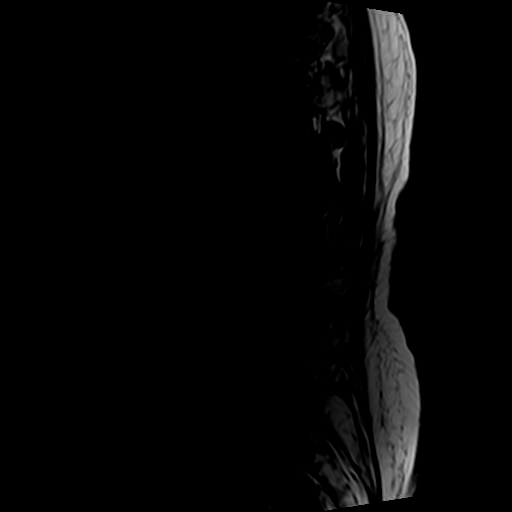
[im 8/16]
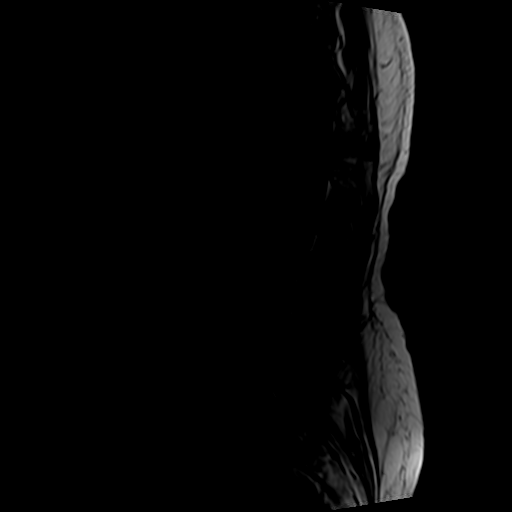
[im 11/16]
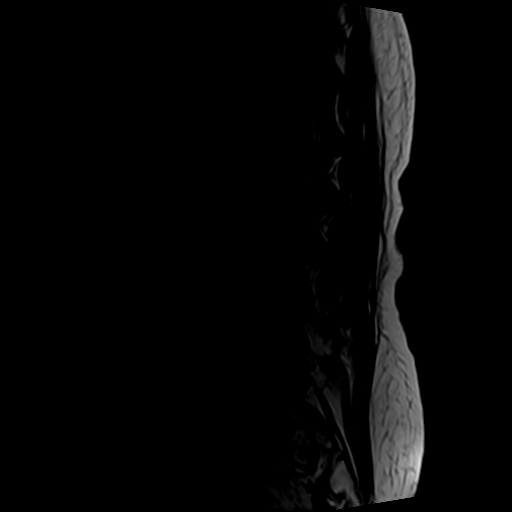
[im 13/16]
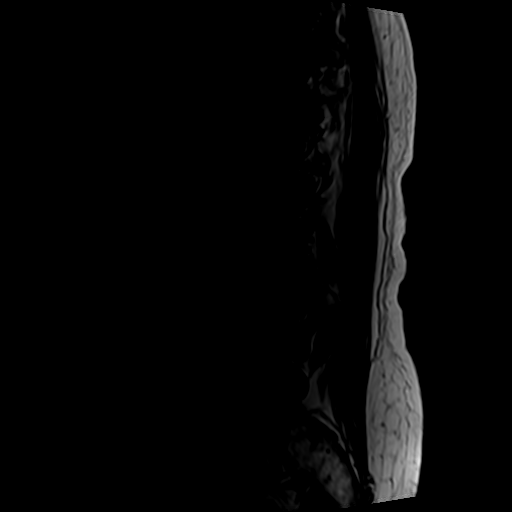
[im 16/16]
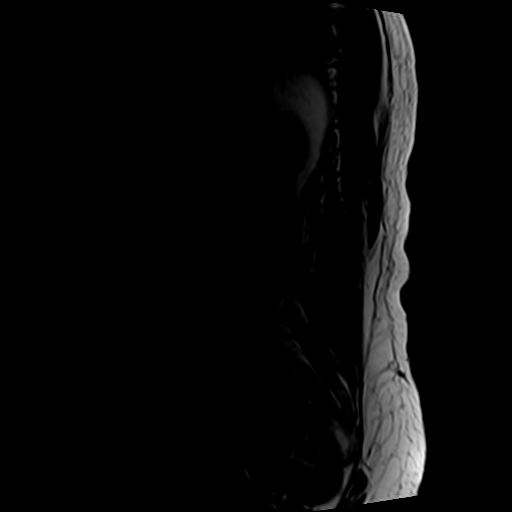

[Series 5: T2 · axial · 4.0mm · 0.70mm/px · z∈[-103,+70]mm · 8 of 32 slices shown (2 of 2)]
[im 1/32]
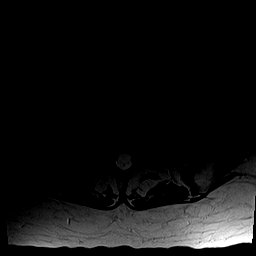
[im 5/32]
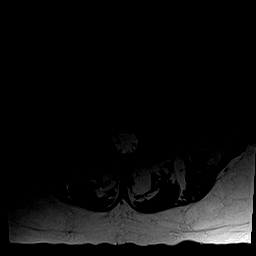
[im 10/32]
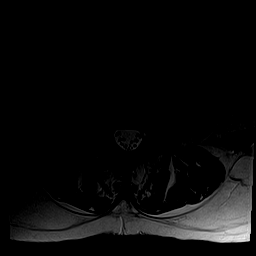
[im 15/32]
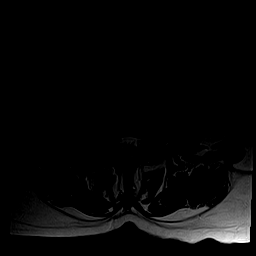
[im 17/32]
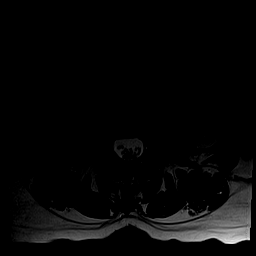
[im 22/32]
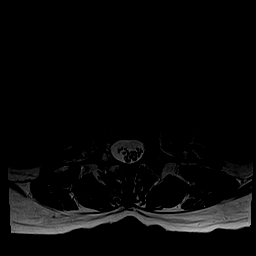
[im 27/32]
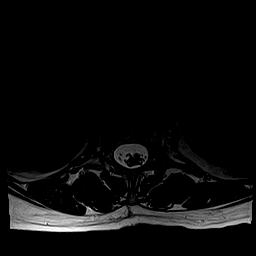
[im 32/32]
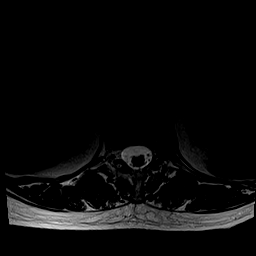

[Series 6: T1 · axial · 4.0mm · 0.35mm/px · z∈[-83,+44]mm · 3 of 32 slices shown (2 of 2)]
[im 5/32]
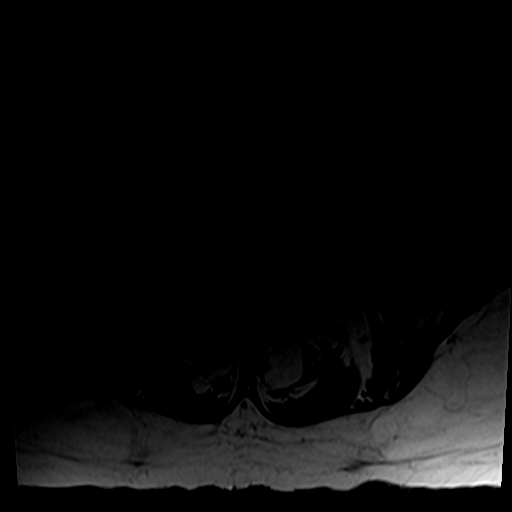
[im 17/32]
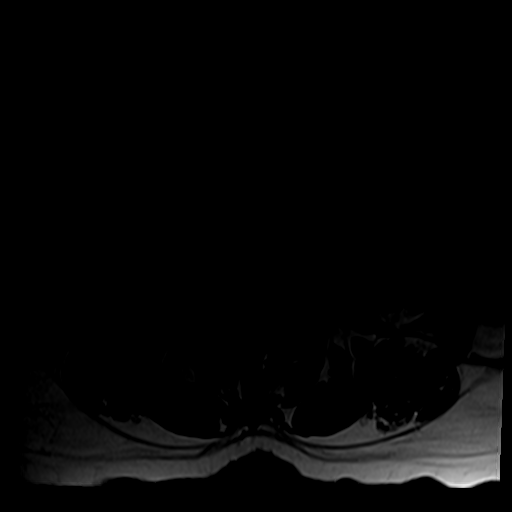
[im 27/32]
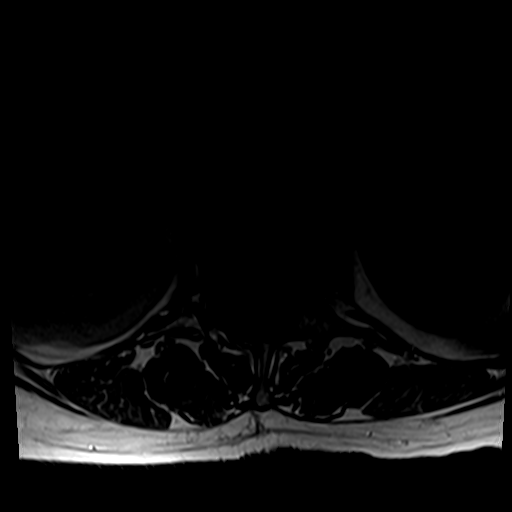

[24 of 48 positions shown; findings below may reference images not displayed]

FINDINGS: Segmentation:  Standard.

Alignment:  Minimal retrolisthesis L2 on L3.

Vertebrae: Extensive discogenic endplate marrow changes throughout
the lumbar spine and extending into the right L4 pedicle. No acute
fracture. No evidence of discitis. No bone lesion.

Conus medullaris and cauda equina: Conus extends to the L2 level.
Conus and cauda equina appear normal.

Paraspinal and other soft tissues: Negative.

Disc levels:

T12-L1: No significant disc protrusion, foraminal stenosis, or canal
stenosis.

L1-L2: Diffuse disc bulge, slightly eccentric to the left and early
bilateral facet arthropathy results in moderate left foraminal
stenosis and mild left subarticular recess stenosis. No canal
stenosis. Findings slightly progressed compared to prior.

L2-L3: Mild disc osteophyte complex and mild bilateral facet
arthrosis resulting in mild-to-moderate bilateral foraminal
stenosis. Mild narrowing of the right subarticular recess. No canal
stenosis. Findings minimally progressed compared to prior.

L3-L4: Mild disc bulge and advanced bilateral facet arthrosis
resulting in moderate right and mild left foraminal stenosis with
mild canal stenosis. Findings slightly progressed compared to prior.

L4-L5: Mild disc bulge and mild bilateral facet arthrosis resulting
in mild-to-moderate bilateral foraminal stenosis. No canal stenosis.
Findings similar to prior.

L5-S1: Mild disc bulge and bilateral facet arthrosis results in
moderate bilateral foraminal stenosis. No canal stenosis. Findings
similar to prior.
IMPRESSION: Moderate multilevel lumbar spondylosis, as detailed above. Findings
are most pronounced at the L3-4 level where there is moderate right
and mild left foraminal stenosis as well as mild canal stenosis.
Overall, findings have slightly progressed when compared with the
previous study.

## 2020-06-20 DIAGNOSIS — H5213 Myopia, bilateral: Secondary | ICD-10-CM | POA: Diagnosis not present

## 2020-06-20 DIAGNOSIS — H2513 Age-related nuclear cataract, bilateral: Secondary | ICD-10-CM | POA: Diagnosis not present

## 2020-07-02 NOTE — Progress Notes (Deleted)
PATIENT: Stacey Short DOB: [birth year]  REASON FOR VISIT: follow up HISTORY FROM: patient  No chief complaint on file.    HISTORY OF PRESENT ILLNESS: Today 07/02/20 JACQUELENE Short is a 74 y.o. female here today for follow up for OSA on CPAP and insomnia. She was started on trazodone 50mg  at last follow up in 08/2019.     HISTORY: (copied from my note on 08/21/2019)  Stacey Short is a 74 y.o. female here today for follow up for moderate OSA on CPAP. She was initially seen in 07/2018 for concerns of insomnia and sleep apnea. Home sleep study in 04/2019 revealed moderate sleep apnea with AHI of 30 and REM AHI of 47.8.  She is adjusting to CPAP therapy.  She has had some difficulty with the full facemask.  She feels that this mask leaks air.  She has picked up a nasal pillow today from her DME company.  She plans to start this tonight.  She feels capable of adjusting mask and headgear.  She is aware of how to check for leak with her CPAP machine.  She continues to have trouble with insomnia.  She states that she can go to sleep without difficulty but then wakes within 2 to 3 hours.  Sometimes she will take 5 mg of Ambien but states that effectiveness is inconsistent.  She has never taken trazodone that she can remember.  She did not do well with Belsomra.  She has not taken Xanax in quite some time.  She does report using her husband's travel CPAP when [place] the [place].  Compliance report dated 07/21/2019 through 08/19/2019 reveals that she is using CPAP 25 of the last 30 days for compliance of 83%.  She used CPAP greater than 4 hours 15 of the last 30 days for compliance of 50%.  Average usage was about 3 hours and 52 minutes.  AHI was 2.8 on 7 to 14 cm of water and an EPR of 3.  There was a significant leak noted in the 95th percentile of 38.1.   History (copied from Dr Dohmeier's note on 08/10/2018)  BHA:LPFXTKW T Stacey Short a 74 y.o.femalepatient, seen hereon  08-10-2018 in areferral from [place] new sleep evaluation,  Stacey Short has had trouble to initiate sleep without medication and to stay asleep without medication.She remembers the onset of these problem after menopause , at age 32.Part of her work-up in the past for these long-standing problems have been a attended sleep study that was performed at the [facility]. The patient estimates that it may have been more than 8years ago. At the time based on these attended sleep study results she was not diagnosed with apnea. She was then discussing with her husband,Dr. Jeanmarie Hubert, what to do next- and heordered a home sleep test in November 2014 for her.  At the time her husbandhad complained about herapnea and habitual loud snoringand he still maintains that observation. TheHST-respiratory disturbance indexwasanalyzedat anRDI of 21.2/h and after these ambulatory data were stripped of artifact the analyzed sleep time was only 181 minutes(which means the patient slept only 3 hours for sure)her RDI now was 30.5/h -there were periods of very high indices of apnea present which may have reflected REM sleep.  Treatment options were discussed with my colleague Star Age 5 years ago who stated that these mild forms of apnea and given her overbite and retrognathiamay be more amendable to a dental device, she also should avoid sleeping on  her back. She also commented that weight loss would not be offering a valid treatment option in apatient that already has a BMI of 25 and works out three times a week.   Chief complaint according to patient :" I don't think that I have sleep apnea" - "I need pills to sleep"- and " I take Belsomra and little bites of Ambien "  Sleep habits are as follows:Dinner time is usually around 7.30 Pm, and bedtime is around 11-midnight, the bedroom is cool, quiet , and dark without electronics or noise infiltrating. She sleeps  on her side, shares the bed with her husband, in a [place] size bed. With Belsomra on board she is asleep in 15 minutes, and she wakes 2.5 hours later, has to go the bathroom. She then takes a tiny bite of the Ambien tablet and sleeps for another 4-5 hours. Feels rested in AM, no headaches, sometimes a stiff neck, no dry mouth, no palpitations. She dreams, but does not recall most dreams- and feels these are pleasant , not anxiety provoking. She wake sup spontaously at 6.30 AM, gets her cup of coffee and returns to bed to read for another 30 minutes- she feels Stacey Short is the best sleep a she had in a long time.   Sleep medical history and family sleep history:insomnia since menopause, at age 69. Mother was a light sleeper.    Social history:married to physician, Two biological adult children, 82 and 38 years old, daughter is a PhD. Non Smoker - quit in 1984, ETOH - daily having wine with dinner, one cocktail on weekend. Caffeine - uses ice tea when eating out at lunch -all decaffeinated beverages.     REVIEW OF SYSTEMS: Out of a complete 14 system review of symptoms, the patient complains only of the following symptoms, and all other reviewed systems are negative.  ALLERGIES: Allergies  Allergen Reactions   Celecoxib     REACTION: swelling   Codeine     REACTION: nausea   Ultram [Tramadol] Nausea And Vomiting    HOME MEDICATIONS: Outpatient Medications Prior to Visit  Medication Sig Dispense Refill   estradiol (ESTRACE) 0.1 MG/GM vaginal cream Place 1 Applicatorful vaginally 2 (two) times a week.     estradiol (VIVELLE-DOT) 0.025 MG/24HR Place 1 patch onto the skin 2 (two) times a week.     Multiple Vitamins-Minerals (MULTIVITAMIN PO) Take 1 tablet by mouth daily.      Omega-3 Fatty Acids (FISH OIL PO) Take by mouth.     RABEprazole (ACIPHEX) 20 MG tablet Take 20 mg by mouth daily as needed (ulcer).     rosuvastatin (CRESTOR) 10 MG tablet Take 5-10 mg by mouth daily with  breakfast. Take 5 mg every other day, and 10 mg other days.     testosterone (ANDROGEL) 50 MG/5GM GEL 5 g 2 (two) times a week.     traZODone (DESYREL) 50 MG tablet Take 1 tablet (50 mg total) by mouth at bedtime. 90 tablet 3   VITAMIN D, CHOLECALCIFEROL, PO Take 1 tablet by mouth daily.     zolpidem (AMBIEN) 10 MG tablet Take 5 mg by mouth at bedtime as needed.     No facility-administered medications prior to visit.    PAST MEDICAL HISTORY: Past Medical History:  Diagnosis Date   COLONIC POLYPS, HYPERPLASTIC, HX OF    H/O gastritis    egd 2009  ,h.pylori negative   Hyperlipidemia    Unspecified hemorrhoids without mention of complication  PAST SURGICAL HISTORY: Past Surgical History:  Procedure Laterality Date   BREAST REDUCTION SURGERY     EXCISION/RELEASE BURSA HIP Right 09/26/2013   Procedure: RIGHT GLUTEAL BURSECTOMY, GLUTEAL TENDON DEBRIDEMENT ;  Surgeon: Gearlean Alf, MD;  Location: [facility];  Service: Orthopedics;  Laterality: Right;   PARTIAL HYSTERECTOMY  1987   REDUCTION MAMMAPLASTY     Thumb joint repair     TUMMY TUCK      FAMILY HISTORY: Family History  Problem Relation Age of Onset   Kidney cancer Sister    Lymphoma Father    Diabetes Mother    Heart disease Mother    Diabetes Sister    Lupus Sister    Colon cancer Neg Hx     SOCIAL HISTORY: Social History   Socioeconomic History   Marital status: Married    Spouse name: Not on file   Number of children: 5   Years of education: Not on file   Highest education level: Master's degree (e.g., MA, MS, MEng, MEd, MSW, MBA)  Occupational History    Comment: retired  Tobacco Use   Smoking status: Former Smoker    Quit date: 10/11/1982    Years since quitting: 37.7   Smokeless tobacco: Never Used  Substance and Sexual Activity   Alcohol use: Yes    Comment: 2 glasses wine daily   Drug use: No   Sexual activity: Not on file  Other Topics Concern   Not on file    Social History Narrative   Lives with husband   Caffeine- very little   Social Determinants of Health   Financial Resource Strain:    Difficulty of Paying Living Expenses: Not on file  Food Insecurity:    Worried About Charity fundraiser in the Last Year: Not on file   [employer] of Food in the Last Year: Not on file  Transportation Needs:    Lack of Transportation (Medical): Not on file   Lack of Transportation (Non-Medical): Not on file  Physical Activity:    Days of Exercise per Week: Not on file   Minutes of Exercise per Session: Not on file  Stress:    Feeling of Stress : Not on file  Social Connections:    Frequency of Communication with Friends and Family: Not on file   Frequency of Social Gatherings with Friends and Family: Not on file   Attends Religious Services: Not on file   Active Member of Clubs or Organizations: Not on file   Attends Archivist Meetings: Not on file   Marital Status: Not on file  Intimate Partner Violence:    Fear of Current or Ex-Partner: Not on file   Emotionally Abused: Not on file   Physically Abused: Not on file   Sexually Abused: Not on file      PHYSICAL EXAM  There were no vitals filed for this visit. There is no height or weight on file to calculate BMI.  Generalized: Well developed, in no acute distress  Cardiology: normal rate and rhythm, no murmur noted Respiratory: clear to auscultation bilaterally  Neurological examination  Mentation: Alert oriented to time, place, history taking. Follows all commands speech and language fluent Cranial nerve II-XII: Pupils were equal round reactive to light. Extraocular movements were full, visual field were full on confrontational test. Facial sensation and strength were normal. Uvula tongue midline. Head turning and shoulder shrug  were normal and symmetric. Motor: The motor testing reveals 5 over 5 strength  of all 4 extremities. Good symmetric motor tone is  noted throughout.  Sensory: Sensory testing is intact to soft touch on all 4 extremities. No evidence of extinction is noted.  Coordination: Cerebellar testing reveals good finger-nose-finger and heel-to-shin bilaterally.  Gait and station: Gait is normal. Tandem gait is normal. Romberg is negative. No drift is seen.  Reflexes: Deep tendon reflexes are symmetric and normal bilaterally.   DIAGNOSTIC DATA (LABS, IMAGING, TESTING) - I reviewed patient records, labs, notes, testing and imaging myself where available.  No flowsheet data found.   Lab Results  Component Value Date   WBC 4.8 09/24/2013   HGB 13.8 09/24/2013   HCT 40.7 09/24/2013   MCV 94.9 09/24/2013   PLT 190 09/24/2013      Component Value Date/Time   NA 137 03/01/2014 1445   K 3.9 03/01/2014 1445   CL 100 03/01/2014 1445   CO2 25 03/01/2014 1445   GLUCOSE 94 03/01/2014 1445   BUN 16 03/01/2014 1445   CREATININE 0.80 03/01/2014 1445   CALCIUM 9.1 03/01/2014 1445   PROT 7.3 12/12/2012 1026   ALBUMIN 4.1 12/12/2012 1026   AST 16 12/12/2012 1026   ALT 17 12/12/2012 1026   ALKPHOS 45 12/12/2012 1026   BILITOT 0.8 12/12/2012 1026   GFRNONAA 75 (L) 03/01/2014 1445   GFRAA 87 (L) 03/01/2014 1445   No results found for: CHOL, HDL, LDLCALC, LDLDIRECT, TRIG, CHOLHDL No results found for: HGBA1C No results found for: VITAMINB12 No results found for: TSH     ASSESSMENT AND PLAN 73 y.o. year old female  has a past medical history of COLONIC POLYPS, HYPERPLASTIC, HX OF, H/O gastritis, Hyperlipidemia, and Unspecified hemorrhoids without mention of complication. here with ***    ICD-10-CM   1. OSA on CPAP  G47.33    Z99.89   2. Insomnia due to medical condition  G47.01        No orders of the defined types were placed in this encounter.    No orders of the defined types were placed in this encounter.     I spent 15 minutes with the patient. 50% of this time was spent counseling and educating patient on plan  of care and medications.    Debbora Presto, FNP-C 07/02/2020, 4:19 PM [facility] [address] [place], [place] [address] [phone]

## 2020-07-03 ENCOUNTER — Ambulatory Visit: Payer: PPO | Admitting: Family Medicine

## 2020-07-21 DIAGNOSIS — M545 Low back pain, unspecified: Secondary | ICD-10-CM | POA: Diagnosis not present

## 2020-07-29 DIAGNOSIS — K432 Incisional hernia without obstruction or gangrene: Secondary | ICD-10-CM | POA: Diagnosis not present

## 2020-07-30 ENCOUNTER — Ambulatory Visit: Payer: Self-pay | Admitting: Surgery

## 2020-07-30 DIAGNOSIS — M5136 Other intervertebral disc degeneration, lumbar region: Secondary | ICD-10-CM | POA: Diagnosis not present

## 2020-07-31 DIAGNOSIS — M5416 Radiculopathy, lumbar region: Secondary | ICD-10-CM | POA: Diagnosis not present

## 2020-07-31 DIAGNOSIS — M5116 Intervertebral disc disorders with radiculopathy, lumbar region: Secondary | ICD-10-CM | POA: Diagnosis not present

## 2020-08-06 DIAGNOSIS — M545 Low back pain, unspecified: Secondary | ICD-10-CM | POA: Diagnosis not present

## 2020-08-06 DIAGNOSIS — G4733 Obstructive sleep apnea (adult) (pediatric): Secondary | ICD-10-CM | POA: Diagnosis not present

## 2020-08-11 DIAGNOSIS — M545 Low back pain, unspecified: Secondary | ICD-10-CM | POA: Diagnosis not present

## 2020-08-12 ENCOUNTER — Ambulatory Visit: Payer: PPO | Admitting: Neurology

## 2020-08-19 ENCOUNTER — Other Ambulatory Visit: Payer: Self-pay

## 2020-08-19 ENCOUNTER — Ambulatory Visit: Payer: PPO | Admitting: Neurology

## 2020-08-19 ENCOUNTER — Encounter: Payer: Self-pay | Admitting: Neurology

## 2020-08-19 VITALS — BP 115/71 | HR 86 | Ht 65.5 in | Wt 150.0 lb

## 2020-08-19 DIAGNOSIS — G4733 Obstructive sleep apnea (adult) (pediatric): Secondary | ICD-10-CM

## 2020-08-19 DIAGNOSIS — G500 Trigeminal neuralgia: Secondary | ICD-10-CM

## 2020-08-19 DIAGNOSIS — G478 Other sleep disorders: Secondary | ICD-10-CM

## 2020-08-19 DIAGNOSIS — G4701 Insomnia due to medical condition: Secondary | ICD-10-CM | POA: Diagnosis not present

## 2020-08-19 DIAGNOSIS — Z9989 Dependence on other enabling machines and devices: Secondary | ICD-10-CM

## 2020-08-19 NOTE — Progress Notes (Signed)
SLEEP MEDICINE CLINIC   Provider:  Larey Seat, M.D.   Primary Care Physician:  Crist Infante, MD   Referring Provider: Crist Infante, MD    Chief Complaint  Patient presents with  . Follow-up    pt alone, rm 11. presents for yearly visit. she states that most nights she uses the machine but finds in middle of the night it gets taken off. she describes since using the trazodone she will fall asleep and like clock work about 2 hrs later wakes up will have 1/2 tab of ambien and sleep 4-5 more hours. states this helps. D[employer]    HPI:  Stacey Short is a 74 y.o. female patient, and here for her regular OSA follow up.  08-19-2020: Stacey Short has been a highly compliant CPAP user but she was last seen in our office this time her compliance to be merged between her travel CPAP and her at home standard CPAP.  She is using an autotitrator at home with a minimum pressure setting of 7 maximum of 14 cmH2O 3 cm expiratory pressure relief.  Her average user time is 6 hours 28 minutes.  Again emerging the data she would be a 97% compliant patient she has a residual AHI of only 0.6/h her 95th percentile pressure on auto titration is 9.8 cmH2O there are minimal air leaks. Mask used is a nasal pillow. P 10- medium. Dr. Carlota Raspberry , her husband , has been part time working for research company- and has been sleeping better since she is treated.     Debbora Presto, NP- 08/21/19 Stacey Short is a 74 y.o. female here today for follow up for moderate OSA on CPAP. She was initially seen in 07/2018 for concerns of insomnia and sleep apnea. Home sleep study in 04/2019 revealed moderate sleep apnea with AHI of 30 and REM AHI of 47.8.  She is adjusting to CPAP therapy.  She has had some difficulty with the full facemask.  She feels that this mask leaks air.  She has picked up a nasal pillow today from her DME company.  She plans to start this tonight.  She feels capable of adjusting mask and headgear.  She is aware  of how to check for leak with her CPAP machine.  She continues to have trouble with insomnia.  She states that she can go to sleep without difficulty but then wakes within 2 to 3 hours.  Sometimes she will take 5 mg of Ambien but states that effectiveness is inconsistent.  She has never taken trazodone that she can remember.  She did not do well with Belsomra.  She has not taken Xanax in quite some time.  She does report using her husband's travel CPAP [place]. Compliance report dated 07/21/2019 through 08/19/2019 reveals that she is using CPAP 25 of the last 30 days for compliance of 83%.  She used CPAP greater than 4 hours 15 of the last 30 days for compliance of 50%.  Average usage was about 3 hours and 52 minutes.  AHI was 2.8 on 7 to 14 cm of water and an EPR of 3.  There was a significant leak noted in the 95th percentile of 38.1.   She had been seen here on 08-10-2018 in a referral from Dr. Joylene Draft for new sleep evaluation,  Stacey Short has had trouble to initiate sleep without medication and to stay asleep without medication. She remembers the onset of these problem after menopause ,  at age 64.   Part of her work-up in the past for these long-standing problems have been a attended sleep study that was performed at the [place].  The patient estimates that it may have been more than 8 years ago.  At the time based on these attended sleep study results she was not diagnosed with apnea. She was then discussing with her husband, Dr. Jeanmarie Hubert, what to do next- and he ordered a home sleep test in November 2014 for her.   At the time her husband had complained about her apnea and habitual loud snoring and he still maintains that observation.  The HST-respiratory disturbance index was analyzed at an RDI of 21.2/h and after these ambulatory data were stripped of artifact the analyzed sleep time was only 181 minutes ( which means the patient slept only 3 hours for  sure) her RDI now was 30.5/h - there were periods of very high indices of apnea present which may have reflected REM sleep.  Treatment options were discussed with my colleague Star Age 5 years ago who stated that these mild forms of apnea and given her overbite and retrognathia may be more amendable to a dental device, she also should avoid sleeping on her back.  She also commented that weight loss would not be offering a valid treatment option in a patient that already has a BMI of 25 and works out three times a week.   Chief complaint according to patient : " I don't think that I have sleep apnea" - "I need pills to sleep"- and " I take Belsomra and little bites of Ambien "   Sleep habits are as follows:  Dinner time is usually around 7.30 Pm, and bedtime is around 11-midnight, the bedroom is cool, quiet , and  dark without electronics or noise infiltrating. She sleeps on her side, shares the bed with her husband,  in a [place] size bed. With Belsomra on board she is asleep in 15 minutes, and she wakes 2.5 hours later, has to go the bathroom. She then takes a tiny bite of the Ambien tablet and sleeps for another 4-5 hours. Feels rested in AM, no headaches, sometimes a stiff neck, no dry mouth, no palpitations. She dreams, but does not recall most dreams- and feels these are pleasant , not anxiety provoking. She wake sup spontaously  at 6.30 AM, gets her cup of coffee and returns to bed to read for another 30 minutes- she feels Stacey Short is the best sleep a she had in a long time.    Sleep medical history and family sleep history: insomnia since menopause, at age 3.  Mother was a light sleeper.    Social history: married to physician,  Two biological adult children, 42 and 2 years old, daughter is a PhD. Non  Smoker - quit in 1984, ETOH - daily having wine with dinner, one cocktail on weekend. Caffeine - uses ice tea when eating out at lunch -all decaffeinated beverages.     Review of Systems: Out of a  complete 14 system review, the patient complains of only the following symptoms, and all other reviewed systems are negative.  Trigeminal neuralgia - left side.  Making the choice of interface more difficult.  Geriatric depression score 0/ 15   Epworth Sleepiness score endorsed at 5 , down from 9/ 24 points  , Fatigue severity score is now 11 but was 19/63 , snoring I now alleviated according to husband.  Social  History   Socioeconomic History  . Marital status: Married    Spouse name: Not on file  . Number of children: 5  . Years of education: Not on file  . Highest education level: Master's degree (e.g., MA, MS, MEng, MEd, MSW, MBA)  Occupational History    Comment: retired  Tobacco Use  . Smoking status: Former Smoker    Quit date: 10/11/1982    Years since quitting: 37.8  . Smokeless tobacco: Never Used  Substance and Sexual Activity  . Alcohol use: Yes    Comment: 2 glasses wine daily  . Drug use: No  . Sexual activity: Not on file  Other Topics Concern  . Not on file  Social History Narrative   Lives with husband   Caffeine- very little   Social Determinants of Health   Financial Resource Strain:   . Difficulty of Paying Living Expenses: Not on file  Food Insecurity:   . Worried About Charity fundraiser in the Last Year: Not on file  . Ran Out of Food in the Last Year: Not on file  Transportation Needs:   . Lack of Transportation (Medical): Not on file  . Lack of Transportation (Non-Medical): Not on file  Physical Activity:   . Days of Exercise per Week: Not on file  . Minutes of Exercise per Session: Not on file  Stress:   . Feeling of Stress : Not on file  Social Connections:   . Frequency of Communication with Friends and Family: Not on file  . Frequency of Social Gatherings with Friends and Family: Not on file  . Attends Religious Services: Not on file  . Active Member of Clubs or Organizations: Not on file  . Attends Archivist Meetings: Not on  file  . Marital Status: Not on file  Intimate Partner Violence:   . Fear of Current or Ex-Partner: Not on file  . Emotionally Abused: Not on file  . Physically Abused: Not on file  . Sexually Abused: Not on file    Family History  Problem Relation Age of Onset  . Kidney cancer Sister   . Lymphoma Father   . Diabetes Mother   . Heart disease Mother   . Diabetes Sister   . Lupus Sister   . Colon cancer Neg Hx     Past Medical History:  Diagnosis Date  . COLONIC POLYPS, HYPERPLASTIC, HX OF   . H/O gastritis    egd 2009  ,h.pylori negative  . Hyperlipidemia   . Unspecified hemorrhoids without mention of complication     Past Surgical History:  Procedure Laterality Date  . BREAST REDUCTION SURGERY    . EXCISION/RELEASE BURSA HIP Right 09/26/2013   Procedure: RIGHT GLUTEAL BURSECTOMY, GLUTEAL TENDON DEBRIDEMENT ;  Surgeon: Gearlean Alf, MD;  Location: [facility];  Service: Orthopedics;  Laterality: Right;  . PARTIAL HYSTERECTOMY  1987  . REDUCTION MAMMAPLASTY    . Thumb joint repair    . TUMMY TUCK      Current Outpatient Medications  Medication Sig Dispense Refill  . cetirizine (ZYRTEC ALLERGY) 10 MG tablet Take 10 mg by mouth daily.    Marland Kitchen estradiol (ESTRACE) 0.1 MG/GM vaginal cream Place 1 Applicatorful vaginally 2 (two) times a week.    . estradiol (VIVELLE-DOT) 0.025 MG/24HR Place 1 patch onto the skin 2 (two) times a week.    . Multiple Vitamins-Minerals (MULTIVITAMIN PO) Take 1 tablet by mouth daily.     . Omega-3 Fatty  Acids (FISH OIL PO) Take by mouth.    . pantoprazole (PROTONIX) 20 MG tablet TAKE ONE DAILY FOR GERD    . rosuvastatin (CRESTOR) 10 MG tablet Take 5-10 mg by mouth daily with breakfast. Take 5 mg every other day, and 10 mg other days.    Marland Kitchen testosterone (ANDROGEL) 50 MG/5GM GEL 5 g 2 (two) times a week.    . traZODone (DESYREL) 50 MG tablet Take 1 tablet (50 mg total) by mouth at bedtime. 90 tablet 3  . vitamin B-12 (CYANOCOBALAMIN) 1000 MCG tablet Take  1,000 mcg by mouth daily.    Marland Kitchen VITAMIN D, CHOLECALCIFEROL, PO Take 1 tablet by mouth daily.    Marland Kitchen zolpidem (AMBIEN) 10 MG tablet Take 5 mg by mouth at bedtime as needed.     No current facility-administered medications for this visit.    Allergies as of 08/19/2020 - Review Complete 08/19/2020  Allergen Reaction Noted  . Celecoxib  04/24/2008  . Codeine  04/24/2008  . Ultram [tramadol] Nausea And Vomiting 09/20/2013    Vitals: BP 115/71   Pulse 86   Ht 5' 5.5" (1.664 m)   Wt 150 lb (68 kg)   BMI 24.58 kg/m  Last Weight:  Wt Readings from Last 1 Encounters:  08/19/20 150 lb (68 kg)   ZOX:WRUE mass index is 24.58 kg/m.     Last Height:   Ht Readings from Last 1 Encounters:  08/19/20 5' 5.5" (1.664 m)    Physical exam:  General: The patient is awake, alert and appears not in acute distress. The patient is well groomed. Head: Normocephalic, atraumatic. Neck is supple. Mallampati 2 plus. neck circumference:15". Nasal airflow patent, Retrognathia is noted.  Cardiovascular:  Regular rate and rhythm , without  murmurs or carotid bruit, and without distended neck veins. Respiratory: Lungs are clear to auscultation. Skin:  Without evidence of edema, or rash Trunk: BMI is 24.9. The patient's posture is erect.  Neurologic exam : The patient is awake and alert, oriented to place and time.   Attention span & concentration ability appears normal.  Speech is fluent, without dysarthria, dysphonia or aphasia.  Mood and affect are appropriate.  Cranial nerves: Pupils are equal and briskly reactive to light. Funduscopic exam without evidence of pallor or edema. Extraocular movements  in vertical and horizontal planes intact and without nystagmus. Visual fields by finger perimetry are intact. Hearing to finger rub intact.   Facial sensation intact to fine touch. Trigeminal pain is sporadic- left  Middle branch.   Facial motor strength is symmetric and tongue and uvula move midline.  Shoulder shrug was symmetrical.   Motor exam: Normal tone, muscle bulk and symmetric strength in all extremities. Sensory:  Fine touch, pinprick and vibration were normal. Coordination: no changes in penmanship-  Gait and station: Patient walks without assistive device and is able unassisted to climb up to the exam table. Strength within normal limits.  Stance is stable and normal. Turns with 3 Steps.  Deep tendon reflexes: in the upper and lower extremities are symmetric and intact.   Assessment:  After physical and neurologic examination, review of laboratory studies,  Personal review of imaging studies, reports of other /same  Imaging studies, results of polysomnography and / or neurophysiology testing and pre-existing records as far as provided in visit., my assessment is:   1) The patient has been snoring for years and struggled with Insomnia, OSA and UARS-  Menopause  Started her insomnia-  She has retrognathia ,she is  slender, and she continues to need sleep aids to give her restfull sleep of 6 hours or more.  On Ambien, but on Trazodone.    2) She should continue to exercise. She likes to drink water.  She already has implemeneted very good sleep hygiene.   3) trigeminal neuralgia - 2-3 days a month, on pregabalin.     The patient was advised of the nature of the diagnosed disorder , the treatment options and the  risks for general health and wellness arising from not treating the condition.   I spent more than 20 minutes of face to face time with the patient.   Plan:  Continue CPAP but switch to [place] trail- ear loops , may it better with trigeminal neuralgia patien.t .  RV on 12 month.   Larey Seat, MD 18/05/6772, 7:36 AM  Certified in Neurology by ABPN Certified in [place] by [facility] [address], [place], [place] [address]

## 2020-08-20 DIAGNOSIS — M545 Low back pain, unspecified: Secondary | ICD-10-CM | POA: Diagnosis not present

## 2020-08-25 ENCOUNTER — Telehealth: Payer: Self-pay | Admitting: Neurology

## 2020-08-25 MED ORDER — TRAZODONE HCL 50 MG PO TABS: 50.0000 mg | ORAL_TABLET | Freq: Every day | ORAL | 3 refills | Status: DC

## 2020-08-25 NOTE — Telephone Encounter (Signed)
Pt is requesting a refill for traZODone (DESYREL) 50 MG tablet.  Ph[facility]: [facility] [address], [place]  Pt. states she is no longer using [facility] & did not provide new pharmacy number.

## 2020-08-25 NOTE — Addendum Note (Signed)
Addended by: Darleen Crocker on: 08/25/2020 01:05 PM   Modules accepted: Orders

## 2020-08-25 NOTE — Telephone Encounter (Signed)
Refill sent to the [facility] on [address] for the pt

## 2020-09-10 DIAGNOSIS — M545 Low back pain, unspecified: Secondary | ICD-10-CM | POA: Diagnosis not present

## 2020-09-22 DIAGNOSIS — M545 Low back pain, unspecified: Secondary | ICD-10-CM | POA: Diagnosis not present

## 2020-09-26 DIAGNOSIS — M545 Low back pain, unspecified: Secondary | ICD-10-CM | POA: Diagnosis not present

## 2020-10-16 DIAGNOSIS — Z01818 Encounter for other preprocedural examination: Secondary | ICD-10-CM | POA: Diagnosis not present

## 2020-10-20 DIAGNOSIS — K432 Incisional hernia without obstruction or gangrene: Secondary | ICD-10-CM | POA: Diagnosis not present

## 2020-11-10 DIAGNOSIS — M5116 Intervertebral disc disorders with radiculopathy, lumbar region: Secondary | ICD-10-CM | POA: Diagnosis not present

## 2020-11-10 DIAGNOSIS — M5416 Radiculopathy, lumbar region: Secondary | ICD-10-CM | POA: Diagnosis not present

## 2020-12-01 ENCOUNTER — Other Ambulatory Visit (HOSPITAL_COMMUNITY): Payer: Self-pay | Admitting: Internal Medicine

## 2020-12-01 MED FILL — MOLNUPIRAVIR 200 MG CAPS: 200 | 5 days supply | Qty: 40 | Fill #0

## 2021-02-03 DIAGNOSIS — M5416 Radiculopathy, lumbar region: Secondary | ICD-10-CM | POA: Diagnosis not present

## 2021-02-03 DIAGNOSIS — M5116 Intervertebral disc disorders with radiculopathy, lumbar region: Secondary | ICD-10-CM | POA: Diagnosis not present

## 2021-02-04 ENCOUNTER — Other Ambulatory Visit: Payer: Self-pay | Admitting: Internal Medicine

## 2021-02-04 DIAGNOSIS — Z1231 Encounter for screening mammogram for malignant neoplasm of breast: Secondary | ICD-10-CM

## 2021-02-19 DIAGNOSIS — E785 Hyperlipidemia, unspecified: Secondary | ICD-10-CM | POA: Diagnosis not present

## 2021-02-19 DIAGNOSIS — Z23 Encounter for immunization: Secondary | ICD-10-CM | POA: Diagnosis not present

## 2021-02-19 DIAGNOSIS — R202 Paresthesia of skin: Secondary | ICD-10-CM | POA: Diagnosis not present

## 2021-02-19 DIAGNOSIS — M810 Age-related osteoporosis without current pathological fracture: Secondary | ICD-10-CM | POA: Diagnosis not present

## 2021-02-19 DIAGNOSIS — G500 Trigeminal neuralgia: Secondary | ICD-10-CM | POA: Diagnosis not present

## 2021-02-19 DIAGNOSIS — G4700 Insomnia, unspecified: Secondary | ICD-10-CM | POA: Diagnosis not present

## 2021-02-19 DIAGNOSIS — M79641 Pain in right hand: Secondary | ICD-10-CM | POA: Diagnosis not present

## 2021-02-19 DIAGNOSIS — M542 Cervicalgia: Secondary | ICD-10-CM | POA: Diagnosis not present

## 2021-02-19 DIAGNOSIS — M79642 Pain in left hand: Secondary | ICD-10-CM | POA: Diagnosis not present

## 2021-02-19 DIAGNOSIS — I2510 Atherosclerotic heart disease of native coronary artery without angina pectoris: Secondary | ICD-10-CM | POA: Diagnosis not present

## 2021-02-19 DIAGNOSIS — M5416 Radiculopathy, lumbar region: Secondary | ICD-10-CM | POA: Diagnosis not present

## 2021-03-31 ENCOUNTER — Other Ambulatory Visit: Payer: Self-pay

## 2021-03-31 ENCOUNTER — Ambulatory Visit
Admission: RE | Admit: 2021-03-31 | Discharge: 2021-03-31 | Disposition: A | Discharge status: Home / Self Care | Payer: PPO | Source: Ambulatory Visit | Attending: Internal Medicine | Admitting: Internal Medicine

## 2021-03-31 DIAGNOSIS — M810 Age-related osteoporosis without current pathological fracture: Secondary | ICD-10-CM | POA: Diagnosis not present

## 2021-03-31 DIAGNOSIS — Z1231 Encounter for screening mammogram for malignant neoplasm of breast: Secondary | ICD-10-CM | POA: Diagnosis not present

## 2021-03-31 DIAGNOSIS — Z780 Asymptomatic menopausal state: Secondary | ICD-10-CM | POA: Diagnosis not present

## 2021-04-21 DIAGNOSIS — M5416 Radiculopathy, lumbar region: Secondary | ICD-10-CM | POA: Diagnosis not present

## 2021-04-21 DIAGNOSIS — M5116 Intervertebral disc disorders with radiculopathy, lumbar region: Secondary | ICD-10-CM | POA: Diagnosis not present

## 2021-04-29 DIAGNOSIS — M810 Age-related osteoporosis without current pathological fracture: Secondary | ICD-10-CM | POA: Diagnosis not present

## 2021-04-29 DIAGNOSIS — E785 Hyperlipidemia, unspecified: Secondary | ICD-10-CM | POA: Diagnosis not present

## 2021-05-06 DIAGNOSIS — G500 Trigeminal neuralgia: Secondary | ICD-10-CM | POA: Diagnosis not present

## 2021-05-06 DIAGNOSIS — R002 Palpitations: Secondary | ICD-10-CM | POA: Diagnosis not present

## 2021-05-06 DIAGNOSIS — M48061 Spinal stenosis, lumbar region without neurogenic claudication: Secondary | ICD-10-CM | POA: Diagnosis not present

## 2021-05-06 DIAGNOSIS — R252 Cramp and spasm: Secondary | ICD-10-CM | POA: Diagnosis not present

## 2021-05-06 DIAGNOSIS — I2510 Atherosclerotic heart disease of native coronary artery without angina pectoris: Secondary | ICD-10-CM | POA: Diagnosis not present

## 2021-05-06 DIAGNOSIS — E785 Hyperlipidemia, unspecified: Secondary | ICD-10-CM | POA: Diagnosis not present

## 2021-05-06 DIAGNOSIS — N39498 Other specified urinary incontinence: Secondary | ICD-10-CM | POA: Diagnosis not present

## 2021-05-06 DIAGNOSIS — G4700 Insomnia, unspecified: Secondary | ICD-10-CM | POA: Diagnosis not present

## 2021-05-06 DIAGNOSIS — M810 Age-related osteoporosis without current pathological fracture: Secondary | ICD-10-CM | POA: Diagnosis not present

## 2021-05-06 DIAGNOSIS — Z0000 Encounter for general adult medical examination without abnormal findings: Secondary | ICD-10-CM | POA: Diagnosis not present

## 2021-05-06 DIAGNOSIS — Z1331 Encounter for screening for depression: Secondary | ICD-10-CM | POA: Diagnosis not present

## 2021-05-06 DIAGNOSIS — R202 Paresthesia of skin: Secondary | ICD-10-CM | POA: Diagnosis not present

## 2021-05-07 DIAGNOSIS — R82998 Other abnormal findings in urine: Secondary | ICD-10-CM | POA: Diagnosis not present

## 2021-05-12 ENCOUNTER — Encounter: Payer: Self-pay | Admitting: Gastroenterology

## 2021-05-22 ENCOUNTER — Encounter (HOSPITAL_COMMUNITY): Payer: PPO

## 2021-06-01 DIAGNOSIS — G500 Trigeminal neuralgia: Secondary | ICD-10-CM | POA: Diagnosis not present

## 2021-06-01 DIAGNOSIS — R519 Headache, unspecified: Secondary | ICD-10-CM | POA: Diagnosis not present

## 2021-06-08 ENCOUNTER — Other Ambulatory Visit (HOSPITAL_COMMUNITY): Payer: Self-pay | Admitting: *Deleted

## 2021-06-09 ENCOUNTER — Ambulatory Visit (HOSPITAL_COMMUNITY)
Admission: RE | Admit: 2021-06-09 | Discharge: 2021-06-09 | Disposition: A | Discharge status: Home / Self Care | Payer: PPO | Source: Ambulatory Visit | Attending: Internal Medicine | Admitting: Internal Medicine

## 2021-06-09 ENCOUNTER — Other Ambulatory Visit: Payer: Self-pay

## 2021-06-09 DIAGNOSIS — M810 Age-related osteoporosis without current pathological fracture: Secondary | ICD-10-CM | POA: Insufficient documentation

## 2021-06-09 MED ORDER — DENOSUMAB 60 MG/ML ~~LOC~~ SOSY
60.0000 mg | PREFILLED_SYRINGE | Freq: Once | SUBCUTANEOUS | Status: AC
  Administered 2021-06-09: 60 mg via SUBCUTANEOUS

## 2021-06-09 MED ORDER — DENOSUMAB 60 MG/ML ~~LOC~~ SOSY
PREFILLED_SYRINGE | SUBCUTANEOUS | Status: AC
  Filled 2021-06-09: qty 1

## 2021-06-19 DIAGNOSIS — G4733 Obstructive sleep apnea (adult) (pediatric): Secondary | ICD-10-CM | POA: Diagnosis not present

## 2021-06-25 DIAGNOSIS — H02052 Trichiasis without entropian right lower eyelid: Secondary | ICD-10-CM | POA: Diagnosis not present

## 2021-06-25 DIAGNOSIS — H2513 Age-related nuclear cataract, bilateral: Secondary | ICD-10-CM | POA: Diagnosis not present

## 2021-06-25 DIAGNOSIS — H524 Presbyopia: Secondary | ICD-10-CM | POA: Diagnosis not present

## 2021-06-29 ENCOUNTER — Encounter: Payer: Self-pay | Admitting: Internal Medicine

## 2021-07-02 DIAGNOSIS — M189 Osteoarthritis of first carpometacarpal joint, unspecified: Secondary | ICD-10-CM | POA: Diagnosis not present

## 2021-07-02 DIAGNOSIS — M1811 Unilateral primary osteoarthritis of first carpometacarpal joint, right hand: Secondary | ICD-10-CM | POA: Diagnosis not present

## 2021-07-02 DIAGNOSIS — M25831 Other specified joint disorders, right wrist: Secondary | ICD-10-CM | POA: Diagnosis not present

## 2021-07-02 DIAGNOSIS — M25531 Pain in right wrist: Secondary | ICD-10-CM | POA: Diagnosis not present

## 2021-07-22 DIAGNOSIS — M25551 Pain in right hip: Secondary | ICD-10-CM | POA: Diagnosis not present

## 2021-08-01 DIAGNOSIS — Z23 Encounter for immunization: Secondary | ICD-10-CM | POA: Diagnosis not present

## 2021-08-03 DIAGNOSIS — M545 Low back pain, unspecified: Secondary | ICD-10-CM | POA: Diagnosis not present

## 2021-08-04 DIAGNOSIS — M5416 Radiculopathy, lumbar region: Secondary | ICD-10-CM | POA: Diagnosis not present

## 2021-08-04 DIAGNOSIS — M5116 Intervertebral disc disorders with radiculopathy, lumbar region: Secondary | ICD-10-CM | POA: Diagnosis not present

## 2021-08-05 DIAGNOSIS — D1801 Hemangioma of skin and subcutaneous tissue: Secondary | ICD-10-CM | POA: Diagnosis not present

## 2021-08-05 DIAGNOSIS — D692 Other nonthrombocytopenic purpura: Secondary | ICD-10-CM | POA: Diagnosis not present

## 2021-08-05 DIAGNOSIS — D225 Melanocytic nevi of trunk: Secondary | ICD-10-CM | POA: Diagnosis not present

## 2021-08-05 DIAGNOSIS — L814 Other melanin hyperpigmentation: Secondary | ICD-10-CM | POA: Diagnosis not present

## 2021-08-05 DIAGNOSIS — L821 Other seborrheic keratosis: Secondary | ICD-10-CM | POA: Diagnosis not present

## 2021-08-05 DIAGNOSIS — L570 Actinic keratosis: Secondary | ICD-10-CM | POA: Diagnosis not present

## 2021-08-19 ENCOUNTER — Ambulatory Visit: Payer: PPO | Admitting: Neurology

## 2021-08-24 ENCOUNTER — Telehealth: Payer: Self-pay | Admitting: *Deleted

## 2021-08-25 ENCOUNTER — Encounter: Payer: Self-pay | Admitting: Internal Medicine

## 2021-08-25 NOTE — Telephone Encounter (Signed)
No return call , missed appointment letter mailed and colonoscopy cancelled.

## 2021-09-02 DIAGNOSIS — M545 Low back pain, unspecified: Secondary | ICD-10-CM | POA: Diagnosis not present

## 2021-09-07 ENCOUNTER — Encounter: Payer: PPO | Admitting: Internal Medicine

## 2021-09-13 DIAGNOSIS — Z1212 Encounter for screening for malignant neoplasm of rectum: Secondary | ICD-10-CM | POA: Diagnosis not present

## 2021-09-16 DIAGNOSIS — M545 Low back pain, unspecified: Secondary | ICD-10-CM | POA: Diagnosis not present

## 2021-09-24 ENCOUNTER — Other Ambulatory Visit: Payer: Self-pay | Admitting: Neurology

## 2021-10-19 ENCOUNTER — Ambulatory Visit (AMBULATORY_SURGERY_CENTER): Payer: Self-pay | Admitting: *Deleted

## 2021-10-19 ENCOUNTER — Other Ambulatory Visit: Payer: Self-pay

## 2021-10-19 VITALS — Ht 64.5 in | Wt 150.0 lb

## 2021-10-19 DIAGNOSIS — Z1211 Encounter for screening for malignant neoplasm of colon: Secondary | ICD-10-CM

## 2021-10-19 MED ORDER — PLENVU 140 G PO SOLR: 1.0000 | Freq: Once | ORAL | 0 refills | Status: AC

## 2021-10-19 NOTE — Progress Notes (Signed)
Patient's pre-visit was done today over the phone with the patient. Name,DOB and address verified. Patient denies any allergies to Eggs and Soy. Patient denies any problems with anesthesia/sedation. Patient is not taking any diet pills or blood thinners. No home Oxygen. Prep instructions sent to pt's MyChart (if activated).Patient understands to call us back with any questions or concerns. Patient is aware of our care-partner policy and DJTTS-17 safety protocol.  Plenvu RX per pt. Request  EMMI education assigned to the patient for the procedure, sent to [place].   The patient is COVID-19 vaccinated.

## 2021-10-29 ENCOUNTER — Encounter: Payer: Self-pay | Admitting: Internal Medicine

## 2021-11-02 ENCOUNTER — Encounter: Payer: PPO | Admitting: Internal Medicine

## 2021-11-04 ENCOUNTER — Other Ambulatory Visit: Payer: Self-pay

## 2021-11-04 ENCOUNTER — Encounter: Payer: Self-pay | Admitting: Internal Medicine

## 2021-11-04 ENCOUNTER — Ambulatory Visit (AMBULATORY_SURGERY_CENTER): Payer: PPO | Admitting: Internal Medicine

## 2021-11-04 VITALS — BP 125/78 | HR 70 | Temp 98.2°F | Resp 16

## 2021-11-04 DIAGNOSIS — Z1211 Encounter for screening for malignant neoplasm of colon: Secondary | ICD-10-CM | POA: Diagnosis not present

## 2021-11-04 DIAGNOSIS — Z86010 Personal history of colonic polyps: Secondary | ICD-10-CM | POA: Diagnosis not present

## 2021-11-04 DIAGNOSIS — G4733 Obstructive sleep apnea (adult) (pediatric): Secondary | ICD-10-CM | POA: Diagnosis not present

## 2021-11-04 MED ORDER — SODIUM CHLORIDE 0.9 % IV SOLN: 500.0000 mL | Freq: Once | INTRAVENOUS | Status: DC

## 2021-11-04 NOTE — Op Note (Signed)
[place] Patient Name: Stacey Short Procedure Date: 11/04/2021 8:57 AM MRN: [id number] Endoscopist: Gatha Mayer , MD Age: 76 Referring MD:  Date of Birth: [birth year] Gender: Female Account #: [id number] Procedure:                Colonoscopy Indications:              Screening for colorectal malignant neoplasm Medicines:                Propofol per Anesthesia, Monitored Anesthesia Care Procedure:                Pre-Anesthesia Assessment:                           - Prior to the procedure, a History and Physical                            was performed, and patient medications and                            allergies were reviewed. The patient's tolerance of                            previous anesthesia was also reviewed. The risks                            and benefits of the procedure and the sedation                            options and risks were discussed with the patient.                            All questions were answered, and informed consent                            was obtained. Prior Anticoagulants: The patient has                            taken no previous anticoagulant or antiplatelet                            agents. ASA Grade Assessment: II - A patient with                            mild systemic disease. After reviewing the risks                            and benefits, the patient was deemed in                            satisfactory condition to undergo the procedure.                           After obtaining informed consent, the colonoscope  was passed under direct vision. Throughout the                            procedure, the patient's blood pressure, pulse, and                            oxygen saturations were monitored continuously. The                            Olympus PCF-H190DL [id number]) Colonoscope was                            introduced through the anus and advanced to the the                             cecum, identified by appendiceal orifice and                            ileocecal valve. The colonoscopy was somewhat                            difficult due to significant looping. Successful                            completion of the procedure was aided by                            straightening and shortening the scope to obtain                            bowel loop reduction and applying abdominal                            pressure. The patient tolerated the procedure well.                            The quality of the bowel preparation was good. The                            ileocecal valve, appendiceal orifice, and rectum                            were photographed. The bowel preparation used was                            Miralax via split dose instruction. Scope In: 9:12:49 AM Scope Out: 9:29:05 AM Scope Withdrawal Time: 0 hours 10 minutes 17 seconds  Total Procedure Duration: 0 hours 16 minutes 16 seconds  Findings:                 The perianal and digital rectal examinations were                            normal.  Multiple diverticula were found in the sigmoid                            colon.                           External and internal hemorrhoids were found.                           The exam was otherwise without abnormality on                            direct and retroflexion views. Complications:            No immediate complications. Estimated Blood Loss:     Estimated blood loss: none. Impression:               - Diverticulosis in the sigmoid colon.                           - External and internal hemorrhoids.                           - The examination was otherwise normal on direct                            and retroflexion views.                           - No specimens collected. Recommendation:           - Patient has a contact number available for                            emergencies. The signs and symptoms of  potential                            delayed complications were discussed with the                            patient. Return to normal activities tomorrow.                            Written discharge instructions were provided to the                            patient.                           - Resume previous diet.                           - Continue present medications.                           - No repeat colonoscopy due to age and the absence  of colonic polyps. Gatha Mayer, MD 11/04/2021 9:34:48 AM This report has been signed electronically.

## 2021-11-04 NOTE — Progress Notes (Signed)
Report given to PACU, vss 

## 2021-11-04 NOTE — Patient Instructions (Addendum)
No polyps or cancer were seen.  I did see diverticulosis and your hemorrhoids were mildly inflamed which is often seen after the preparation and diarrhea.  I appreciate the opportunity to care for you. Gatha Mayer, MD, FACG  YOU HAD AN ENDOSCOPIC PROCEDURE TODAY AT [facility]:   Refer to the procedure report that was given to you for any specific questions about what was found during the examination.  If the procedure report does not answer your questions, please call your gastroenterologist to clarify.  If you requested that your care partner not be given the details of your procedure findings, then the procedure report has been included in a sealed envelope for you to review at your convenience later.  YOU SHOULD EXPECT: Some feelings of bloating in the abdomen. Passage of more gas than usual.  Walking can help get rid of the air that was put into your GI tract during the procedure and reduce the bloating. If you had a lower endoscopy (such as a colonoscopy or flexible sigmoidoscopy) you may notice spotting of blood in your stool or on the toilet paper. If you underwent a bowel prep for your procedure, you may not have a normal bowel movement for a few days.  Please Note:  You might notice some irritation and congestion in your nose or some drainage.  This is from the oxygen used during your procedure.  There is no need for concern and it should clear up in a day or so.  SYMPTOMS TO REPORT IMMEDIATELY:  Following lower endoscopy (colonoscopy or flexible sigmoidoscopy):  Excessive amounts of blood in the stool  Significant tenderness or worsening of abdominal pains  Swelling of the abdomen that is new, acute  Fever of 100F or higher   For urgent or emergent issues, a gastroenterologist can be reached at any hour by calling [phone]. Do not use MyChart messaging for urgent concerns.    DIET:  We do recommend a small meal at first, but then you may proceed to  your regular diet.  Drink plenty of fluids but you should avoid alcoholic beverages for 24 hours.  ACTIVITY:  You should plan to take it easy for the rest of today and you should NOT DRIVE or use heavy machinery until tomorrow (because of the sedation medicines used during the test).    FOLLOW UP: Our staff will call the number listed on your records 48-72 hours following your procedure to check on you and address any questions or concerns that you may have regarding the information given to you following your procedure. If we do not reach you, we will leave a message.  We will attempt to reach you two times.  During this call, we will ask if you have developed any symptoms of COVID 19. If you develop any symptoms (ie: fever, flu-like symptoms, shortness of breath, cough etc.) before then, please call [phone].  If you test positive for Covid 19 in the 2 weeks post procedure, please call and report this information to [place].    If any biopsies were taken you will be contacted by phone or by letter within the next 1-3 weeks.  Please call us at [phone] if you have not heard about the biopsies in 3 weeks.    SIGNATURES/CONFIDENTIALITY: You and/or your care partner have signed paperwork which will be entered into your electronic medical record.  These signatures attest to the fact that that the information above on your After Visit Summary  has been reviewed and is understood.  Full responsibility of the confidentiality of this discharge information lies with you and/or your care-partner.

## 2021-11-04 NOTE — Progress Notes (Signed)
[facility] History and Physical   Primary Care Physician:  Crist Infante, MD   Reason for Procedure:   Colon cancer screening  Plan:    colonoscopy     HPI: Stacey Short is a 76 y.o. female here for screening exam - hx distal hyperplastic polyps but no adenomas so screening   Past Medical History:  Diagnosis Date   COLONIC POLYPS, HYPERPLASTIC, HX OF    GERD (gastroesophageal reflux disease)    H/O gastritis    egd 2009  ,h.pylori negative   Hyperlipidemia    Sleep apnea    Unspecified hemorrhoids without mention of complication     Past Surgical History:  Procedure Laterality Date   BREAST REDUCTION SURGERY     CLOSED REDUCTION PROXIMAL ULNAR FRACTURE     EXCISION/RELEASE BURSA HIP Right 09/26/2013   Procedure: RIGHT GLUTEAL BURSECTOMY, GLUTEAL TENDON DEBRIDEMENT ;  Surgeon: Gearlean Alf, MD;  Location: [facility];  Service: Orthopedics;  Laterality: Right;   PARTIAL HYSTERECTOMY  1987   REDUCTION MAMMAPLASTY     Thumb joint repair     TUMMY TUCK      Prior to Admission medications   Medication Sig Start Date End Date Taking? Authorizing Provider  cetirizine (ZYRTEC) 10 MG tablet Take 10 mg by mouth daily.   Yes [provider]  estradiol (ESTRACE) 0.1 MG/GM vaginal cream Place 1 Applicatorful vaginally 2 (two) times a week.   Yes [provider]  estradiol (VIVELLE-DOT) 0.025 MG/24HR Place 1 patch onto the skin 2 (two) times a week.   Yes [provider]  Multiple Vitamins-Minerals (MULTIVITAMIN PO) Take 1 tablet by mouth daily.    Yes [provider]  pantoprazole (PROTONIX) 20 MG tablet TAKE ONE DAILY FOR GERD 06/14/20  Yes [provider]  rosuvastatin (CRESTOR) 10 MG tablet Take 5-10 mg by mouth daily with breakfast. Take 5 mg every other day, and 10 mg other days.   Yes [provider]  traZODone (DESYREL) 50 MG tablet Take 1 tablet (50 mg total) by mouth at bedtime. Please keep upcoming appt for  continued refills 09/24/21  Yes Dohmeier, Asencion Partridge, MD  VITAMIN D, CHOLECALCIFEROL, PO Take 1 tablet by mouth daily.   Yes [provider]  zolpidem (AMBIEN) 10 MG tablet Take 5 mg by mouth at bedtime as needed.   Yes [provider]  testosterone (ANDROGEL) 50 MG/5GM GEL 5 g 2 (two) times a week.    [provider]    Current Outpatient Medications  Medication Sig Dispense Refill   cetirizine (ZYRTEC) 10 MG tablet Take 10 mg by mouth daily.     estradiol (ESTRACE) 0.1 MG/GM vaginal cream Place 1 Applicatorful vaginally 2 (two) times a week.     estradiol (VIVELLE-DOT) 0.025 MG/24HR Place 1 patch onto the skin 2 (two) times a week.     Multiple Vitamins-Minerals (MULTIVITAMIN PO) Take 1 tablet by mouth daily.      pantoprazole (PROTONIX) 20 MG tablet TAKE ONE DAILY FOR GERD     rosuvastatin (CRESTOR) 10 MG tablet Take 5-10 mg by mouth daily with breakfast. Take 5 mg every other day, and 10 mg other days.     traZODone (DESYREL) 50 MG tablet Take 1 tablet (50 mg total) by mouth at bedtime. Please keep upcoming appt for continued refills 90 tablet 0   VITAMIN D, CHOLECALCIFEROL, PO Take 1 tablet by mouth daily.     zolpidem (AMBIEN) 10 MG tablet Take 5 mg by  mouth at bedtime as needed.     testosterone (ANDROGEL) 50 MG/5GM GEL 5 g 2 (two) times a week.     Current Facility-Administered Medications  Medication Dose Route Frequency Provider Last Rate Last Admin   0.9 %  sodium chloride infusion  500 mL Intravenous Once Gatha Mayer, MD        Allergies as of 11/04/2021 - Review Complete 11/04/2021  Allergen Reaction Noted   Celecoxib  04/24/2008   Codeine  04/24/2008   Ultram [tramadol] Nausea And Vomiting 09/20/2013    Family History  Problem Relation Age of Onset   Diabetes Mother    Heart disease Mother    Lymphoma Father    Kidney cancer Sister    Diabetes Sister    Lupus Sister    Colon cancer Neg Hx    Colon polyps Neg Hx    Esophageal cancer Neg  Hx    Stomach cancer Neg Hx    Rectal cancer Neg Hx     Social History   Socioeconomic History   Marital status: Married    Spouse name: Not on file   Number of children: 5   Years of education: Not on file   Highest education level: Master's degree (e.g., MA, MS, MEng, MEd, MSW, MBA)  Occupational History    Comment: retired  Tobacco Use   Smoking status: Former    Types: Cigarettes    Quit date: 10/11/1982    Years since quitting: 39.0   Smokeless tobacco: Never  Substance and Sexual Activity   Alcohol use: Yes    Comment: 2 glasses wine daily   Drug use: No   Sexual activity: Not on file  Other Topics Concern   Not on file  Social History Narrative   Lives with husband   Caffeine- very little      Review of Systems:   other review of systems negative except as mentioned in the HPI.  Physical Exam: Vital signs Temp 98.2 F (36.8 C)   General:   Alert,  Well-developed, well-nourished, pleasant and cooperative in NAD Lungs:  Clear throughout to auscultation.   Heart:  Regular rate and rhythm; no murmurs, clicks, rubs,  or gallops. Abdomen:  Soft, nontender and nondistended. Normal bowel sounds.   Neuro/Psych:  Alert and cooperative. Normal mood and affect. A and O x 3   @[name]  Simonne Maffucci, MD, [facility] [phone] (pager) 11/04/2021 9:04 AM@

## 2021-11-04 NOTE — Progress Notes (Signed)
C.W. vital signs. 

## 2021-11-04 NOTE — Progress Notes (Signed)
Pt's states no medical or surgical changes since previsit or office visit. 

## 2021-11-05 DIAGNOSIS — M25831 Other specified joint disorders, right wrist: Secondary | ICD-10-CM | POA: Diagnosis not present

## 2021-11-05 DIAGNOSIS — M1811 Unilateral primary osteoarthritis of first carpometacarpal joint, right hand: Secondary | ICD-10-CM | POA: Diagnosis not present

## 2021-11-06 ENCOUNTER — Telehealth: Payer: Self-pay

## 2021-11-06 ENCOUNTER — Encounter: Payer: PPO | Admitting: Internal Medicine

## 2021-11-06 ENCOUNTER — Telehealth: Payer: Self-pay | Admitting: *Deleted

## 2021-11-06 NOTE — Telephone Encounter (Signed)
°  Follow up Call-  Call back number 11/04/2021  Post procedure Call Back phone  # [phone]  Permission to leave phone message Yes  Some recent data might be hidden     Patient questions:  Do you have a fever, pain , or abdominal swelling? No. Pain Score  0 *  Have you tolerated food without any problems? Yes.    Have you been able to return to your normal activities? Yes.    Do you have any questions about your discharge instructions: Diet   No. Medications  No. Follow up visit  No.  Do you have questions or concerns about your Care? No.  Actions: * If pain score is 4 or above: No action needed, pain <4.  Have you developed a fever since your procedure? no  2.   Have you had an respiratory symptoms (SOB or cough) since your procedure? o  3.   Have you tested positive for COVID 19 since your procedure no  4.   Have you had any family members/close contacts diagnosed with the COVID 19 since your procedure?  no   If yes to any of these questions please route to Joylene John, RN and Joella Prince, RN

## 2021-11-06 NOTE — Telephone Encounter (Signed)
°  Follow up Call-  Call back number 11/04/2021  Post procedure Call Back phone  # [phone]  Permission to leave phone message Yes  Some recent data might be hidden   First follow-up call, LVM.

## 2021-11-12 DIAGNOSIS — M5116 Intervertebral disc disorders with radiculopathy, lumbar region: Secondary | ICD-10-CM | POA: Diagnosis not present

## 2021-11-12 DIAGNOSIS — M5416 Radiculopathy, lumbar region: Secondary | ICD-10-CM | POA: Diagnosis not present

## 2021-11-19 ENCOUNTER — Other Ambulatory Visit (HOSPITAL_COMMUNITY): Payer: Self-pay | Admitting: *Deleted

## 2021-11-20 ENCOUNTER — Inpatient Hospital Stay (HOSPITAL_COMMUNITY): Admission: RE | Admit: 2021-11-20 | Payer: PPO | Source: Ambulatory Visit

## 2021-11-23 ENCOUNTER — Encounter: Payer: Self-pay | Admitting: Neurology

## 2021-11-23 ENCOUNTER — Ambulatory Visit (INDEPENDENT_AMBULATORY_CARE_PROVIDER_SITE_OTHER): Payer: PPO | Admitting: Neurology

## 2021-11-23 VITALS — BP 123/75 | HR 77 | Ht 64.5 in | Wt 154.5 lb

## 2021-11-23 DIAGNOSIS — Z9989 Dependence on other enabling machines and devices: Secondary | ICD-10-CM | POA: Diagnosis not present

## 2021-11-23 DIAGNOSIS — M722 Plantar fascial fibromatosis: Secondary | ICD-10-CM

## 2021-11-23 DIAGNOSIS — G4733 Obstructive sleep apnea (adult) (pediatric): Secondary | ICD-10-CM | POA: Diagnosis not present

## 2021-11-23 DIAGNOSIS — G478 Other sleep disorders: Secondary | ICD-10-CM | POA: Diagnosis not present

## 2021-11-23 DIAGNOSIS — G4701 Insomnia due to medical condition: Secondary | ICD-10-CM

## 2021-11-23 DIAGNOSIS — G500 Trigeminal neuralgia: Secondary | ICD-10-CM | POA: Diagnosis not present

## 2021-11-23 NOTE — Patient Instructions (Signed)

## 2021-11-23 NOTE — Progress Notes (Signed)
SLEEP MEDICINE CLINIC   Provider:  Larey Seat, M.D.   Primary Care Physician:  Crist Infante, MD   Referring Provider: Crist Infante, MD    Chief Complaint  Patient presents with   Obstructive Sleep Apnea    Rm 11, alone. Here for yearly CPAP f/u. Pt reports doing well on CPAP. Has used her travel CPAP on some occasions. Pt would like to know more about using an oral appliance, one of her friends suggested she try.     HPI:  Stacey Short is a 76 y.o. female patient, and here for her regular OSA follow up, and she is a patient of Dr. Joylene Draft. She underwent a HST last time, and this confirmed OSA; she has 2 CPAPs one regular and one for travel.  AHI was  13.7/h ,  REM sleep apnea: 27/h. No prolonged hypoxia. Still using a headgear, never switched to the bella swift. Asking about a dental guard.   Stacey Short has been using her home CPAP machine 16 out of 30 days but she also owns a travel CPAP, but she also owns a travel CPAP. Her compliance is therefore spotty - average 6 h 19 minutes of nightly use, residual AHI of 0.8/h.  The 95th percentile pressure is 9.6 cm water.  Minimum pressure setting of 7 maximum of 14 cm of water with 3 cm EPR.  This is comfortable and we will not need to change it.  The machine is only 75 years old so at this time there is no need to replace replace it yet.  A dental device for a patient with REM accentuated apnea will do usually not reach a decrease in apnea by more than 70%. .      08-19-2020: Stacey Short has been a highly compliant CPAP user but she was last seen in our office this time her compliance to be merged between her travel CPAP and her at home standard CPAP.  She is using an autotitrator at home with a minimum pressure setting of 7 maximum of 14 cmH2O 3 cm expiratory pressure relief.  Her average user time is 6 hours 28 minutes.  Again emerging the data she would be a 97% compliant patient she has a residual AHI of only 0.6/h her 95th  percentile pressure on auto titration is 9.8 cmH2O there are minimal air leaks. Mask used is a nasal pillow. P 10- medium. Dr. Carlota Raspberry , her husband , has been part time working for research company- and has been sleeping better since she is treated.     Stacey Presto, NP- 08/21/19 Stacey Short is a 76 y.o. female here today for follow up for moderate OSA on CPAP. She was initially seen in 07/2018 for concerns of insomnia and sleep apnea. Home sleep study in 04/2019 revealed moderate sleep apnea with AHI of 30 and REM AHI of 47.8.  She is adjusting to CPAP therapy.  She has had some difficulty with the full facemask.  She feels that this mask leaks air.  She has picked up a nasal pillow today from her DME company.  She plans to start this tonight.  She feels capable of adjusting mask and headgear.  She is aware of how to check for leak with her CPAP machine.  She continues to have trouble with insomnia.  She states that she can go to sleep without difficulty but then wakes within 2 to 3 hours.  Sometimes she will take 5 mg of Ambien but states  that effectiveness is inconsistent.  She has never taken trazodone that she can remember.  She did not do well with Belsomra.  She has not taken Xanax in quite some time.  She does report using her husband's travel CPAP [place]. Compliance report dated 07/21/2019 through 08/19/2019 reveals that she is using CPAP 25 of the last 30 days for compliance of 83%.  She used CPAP greater than 4 hours 15 of the last 30 days for compliance of 50%.  Average usage was about 3 hours and 52 minutes.  AHI was 2.8 on 7 to 14 cm of water and an EPR of 3. There was a significant leak noted in the 95th percentile of 38.1.   She had been seen here on 08-10-2018 in a referral from Dr. Joylene Draft for new sleep evaluation,  Stacey Short has had trouble to initiate sleep without medication and to stay asleep without medication. She remembers the onset of these problem after  menopause , at age 23.   Part of her work-up in the past for these long-standing problems have been a attended sleep study that was performed at the [facility].  The patient estimates that it may have been more than 8 years ago.  At the time based on these attended sleep study results she was not diagnosed with apnea. She was then discussing with her husband, Stacey Short, what to do next- and he ordered a home sleep test in November 2014 for her.   At the time her husband had complained about her apnea and habitual loud snoring and he still maintains that observation.  The HST-respiratory disturbance index was analyzed at an RDI of 21.2/h and after these ambulatory data were stripped of artifact the analyzed sleep time was only 181 minutes ( which means the patient slept only 3 hours for sure) her RDI now was 30.5/h - there were periods of very high indices of apnea present which may have reflected REM sleep.  Treatment options were discussed with my colleague Star Age 5 years ago who stated that these mild forms of apnea and given her overbite and retrognathia may be more amendable to a dental device, she also should avoid sleeping on her back.  She also commented that weight loss would not be offering a valid treatment option in a patient that already has a BMI of 25 and works out three times a week.   Chief complaint according to patient : " I don't think that I have sleep apnea" - "I need pills to sleep"- and " I take Belsomra and little bites of Ambien "   Sleep habits are as follows:  Dinner time is usually around 7.30 Pm, and bedtime is around 11-midnight, the bedroom is cool, quiet , and  dark without electronics or noise infiltrating. She sleeps on her side, shares the bed with her husband,  in a [place] size bed. With Belsomra on board she is asleep in 15 minutes, and she wakes 2.5 hours later, has to go the bathroom. She then takes a tiny bite of the Ambien tablet and  sleeps for another 4-5 hours. Feels rested in AM, no headaches, sometimes a stiff neck, no dry mouth, no palpitations. She dreams, but does not recall most dreams- and feels these are pleasant , not anxiety provoking. She wake sup spontaously  at 6.30 AM, gets her cup of coffee and returns to bed to read for another 30 minutes- she feels  [name] is the best sleep a she had in a long time.    Sleep medical history and family sleep history: insomnia since menopause, at age 36.  Mother was a light sleeper.    Social history: married to physician,  Two biological adult children, 85 and 88 years old, daughter is a PhD. Non  Smoker - quit in 1984, ETOH - daily having wine with dinner, one cocktail on weekend. Caffeine - uses ice tea when eating out at lunch -all decaffeinated beverages.     Review of Systems: Out of a complete 14 system review, the patient complains of only the following symptoms, and all other reviewed systems are negative.  Trigeminal neuralgia - left side.  Making the choice of interface more difficult.  Geriatric depression score 0/ 15   Epworth Sleepiness score endorsed at 5/24 , down from 9/ 24 points.  Fatigue severity score is now 11 but was 19/63 , snoring I now alleviated according to husband.  Social History   Socioeconomic History   Marital status: Married    Spouse name: Not on file   Number of children: 5   Years of education: Not on file   Highest education level: Master's degree (e.g., MA, MS, MEng, MEd, MSW, MBA)  Occupational History    Comment: retired  Tobacco Use   Smoking status: Former    Types: Cigarettes    Quit date: 10/11/1982    Years since quitting: 39.1   Smokeless tobacco: Never  Substance and Sexual Activity   Alcohol use: Yes    Comment: 2 glasses wine daily   Drug use: No   Sexual activity: Not on file  Other Topics Concern   Not on file  Social History Narrative   Lives with husband   Caffeine- very little   Social Determinants of  Health   Financial Resource Strain: Not on file  Food Insecurity: Not on file  Transportation Needs: Not on file  Physical Activity: Not on file  Stress: Not on file  Social Connections: Not on file  Intimate Partner Violence: Not on file    Family History  Problem Relation Age of Onset   Diabetes Mother    Heart disease Mother    Lymphoma Father    Kidney cancer Sister    Diabetes Sister    Lupus Sister    Colon cancer Neg Hx    Colon polyps Neg Hx    Esophageal cancer Neg Hx    Stomach cancer Neg Hx    Rectal cancer Neg Hx     Past Medical History:  Diagnosis Date   COLONIC POLYPS, HYPERPLASTIC, HX OF    GERD (gastroesophageal reflux disease)    H/O gastritis    egd 2009  ,h.pylori negative   Hyperlipidemia    Sleep apnea    Unspecified hemorrhoids without mention of complication     Past Surgical History:  Procedure Laterality Date   BREAST REDUCTION SURGERY     CLOSED REDUCTION PROXIMAL ULNAR FRACTURE     EXCISION/RELEASE BURSA HIP Right 09/26/2013   Procedure: RIGHT GLUTEAL BURSECTOMY, GLUTEAL TENDON DEBRIDEMENT ;  Surgeon: Gearlean Alf, MD;  Location: [facility];  Service: Orthopedics;  Laterality: Right;   PARTIAL HYSTERECTOMY  1987   REDUCTION MAMMAPLASTY     Thumb joint repair     TUMMY TUCK      Current Outpatient Medications  Medication Sig Dispense Refill   cetirizine (ZYRTEC) 10 MG tablet Take 10 mg by mouth daily.  estradiol (ESTRACE) 0.1 MG/GM vaginal cream Place 1 Applicatorful vaginally 2 (two) times a week.     estradiol (VIVELLE-DOT) 0.025 MG/24HR Place 1 patch onto the skin 2 (two) times a week.     Multiple Vitamins-Minerals (MULTIVITAMIN PO) Take 1 tablet by mouth daily.      pantoprazole (PROTONIX) 20 MG tablet TAKE ONE DAILY FOR GERD     rosuvastatin (CRESTOR) 10 MG tablet Take 5-10 mg by mouth daily with breakfast. Take 5 mg every other day, and 10 mg other days.     testosterone (ANDROGEL) 50 MG/5GM GEL 5 g 2 (two) times a week.      traZODone (DESYREL) 50 MG tablet Take 1 tablet (50 mg total) by mouth at bedtime. Please keep upcoming appt for continued refills 90 tablet 0   VITAMIN D, CHOLECALCIFEROL, PO Take 1 tablet by mouth daily.     zolpidem (AMBIEN) 10 MG tablet Take 5 mg by mouth at bedtime as needed.     No current facility-administered medications for this visit.    Allergies as of 11/23/2021 - Review Complete 11/04/2021  Allergen Reaction Noted   Celecoxib  04/24/2008   Codeine  04/24/2008   Ultram [tramadol] Nausea And Vomiting 09/20/2013    Vitals: BP 123/75    Pulse 77    Ht 5' 4.5" (1.638 m)    Wt 154 lb 8 oz (70.1 kg)    BMI 26.11 kg/m  Last Weight:  Wt Readings from Last 1 Encounters:  11/23/21 154 lb 8 oz (70.1 kg)   ZLD:JTTS mass index is 26.11 kg/m.     Last Height:   Ht Readings from Last 1 Encounters:  11/23/21 5' 4.5" (1.638 m)    Physical exam:  General: The patient is awake, alert and appears not in acute distress. The patient is well groomed. Head: Normocephalic, atraumatic. Neck is supple. Mallampati 2 plus. neck circumference:15". Nasal airflow patent, Retrognathia is noted.  Cardiovascular:  Regular rate and rhythm , without  murmurs or carotid bruit, and without distended neck veins. Respiratory: Lungs are clear to auscultation. Skin:  Without evidence of edema, or rash Trunk: BMI is 24.9. The patient's posture is erect.  Neurologic exam : The patient is awake and alert, oriented to place and time.   Attention span & concentration ability appears normal.  Speech is fluent, without dysarthria, dysphonia or aphasia.  Mood and affect are appropriate.  Cranial nerves: Pupils are equal and briskly reactive to light. Funduscopic exam without evidence of pallor or edema. Extraocular movements  in vertical and horizontal planes intact and without nystagmus. Visual fields by finger perimetry are intact. Hearing to finger rub intact.   Facial sensation intact to fine touch.  Trigeminal pain is sporadic- left  Middle branch.   Facial motor strength is symmetric and tongue and uvula move midline. Shoulder shrug was symmetrical.   Motor exam: Normal tone, muscle bulk and symmetric strength in all extremities. Sensory:  Fine touch, pinprick and vibration were normal. Coordination: no changes in penmanship-  Gait and station: Patient walks without assistive device and is able unassisted to climb up to the exam table. Strength within normal limits.  Stance is stable and normal. Turns with 3 Steps.  Deep tendon reflexes: in the upper and lower extremities are symmetric and intact.   Assessment:  After physical and neurologic examination, review of laboratory studies,  Personal review of imaging studies, reports of other /same  Imaging studies, results of polysomnography and / or neurophysiology testing  and pre-existing records as far as provided in visit., my assessment is:   1) The patient has been snoring for years and struggled with Insomnia, OSA and UARS-  Menopause  Started her insomnia-  She has retrognathia ,she is slender, and she continues to need sleep aids to give her restfull sleep of 6 hours or more. On  Trazodone.    2) She should continue to exercise. She likes to drink water.  She already has implemeneted very good sleep hygiene.  Continue to use CPAP/   3) trigeminal neuralgia - 2-3 days  a month over the last 6  months, on pregabalin.     The patient was advised of the nature of the diagnosed disorder , the treatment options and the  risks for general health and wellness arising from not treating the condition.   I spent more than 25 minutes of face to face time with the patient.   Plan:  Continue CPAP but switch to bella swift - ear loops , may it better with trigeminal neuralgia patient .  RV on 12 month.   Larey Seat, MD 04/16/8674, 4:49 PM  Certified in Neurology by ABPN Certified in [place] by [facility] [address] [place], [place] [address]

## 2021-11-24 NOTE — Progress Notes (Signed)
CM sent to [facility] for new order ?

## 2021-12-01 NOTE — Telephone Encounter (Signed)
Follow up call LVM

## 2021-12-12 ENCOUNTER — Other Ambulatory Visit: Payer: Self-pay | Admitting: Neurology

## 2021-12-21 ENCOUNTER — Other Ambulatory Visit: Payer: Self-pay

## 2021-12-21 ENCOUNTER — Ambulatory Visit (HOSPITAL_COMMUNITY)
Admission: RE | Admit: 2021-12-21 | Discharge: 2021-12-21 | Disposition: A | Discharge status: Home / Self Care | Payer: PPO | Source: Ambulatory Visit | Attending: Internal Medicine | Admitting: Internal Medicine

## 2021-12-21 DIAGNOSIS — M810 Age-related osteoporosis without current pathological fracture: Secondary | ICD-10-CM | POA: Insufficient documentation

## 2021-12-21 MED ORDER — DENOSUMAB 60 MG/ML ~~LOC~~ SOSY
PREFILLED_SYRINGE | SUBCUTANEOUS | Status: AC
  Filled 2021-12-21: qty 1

## 2021-12-21 MED ORDER — DENOSUMAB 60 MG/ML ~~LOC~~ SOSY
60.0000 mg | PREFILLED_SYRINGE | Freq: Once | SUBCUTANEOUS | Status: AC
  Administered 2021-12-21: 60 mg via SUBCUTANEOUS

## 2021-12-31 DIAGNOSIS — R519 Headache, unspecified: Secondary | ICD-10-CM | POA: Diagnosis not present

## 2021-12-31 DIAGNOSIS — G500 Trigeminal neuralgia: Secondary | ICD-10-CM | POA: Diagnosis not present

## 2021-12-31 DIAGNOSIS — M542 Cervicalgia: Secondary | ICD-10-CM | POA: Diagnosis not present

## 2022-01-01 ENCOUNTER — Other Ambulatory Visit: Payer: Self-pay | Admitting: Internal Medicine

## 2022-01-01 DIAGNOSIS — M542 Cervicalgia: Secondary | ICD-10-CM

## 2022-01-04 ENCOUNTER — Other Ambulatory Visit: Payer: Self-pay

## 2022-01-04 ENCOUNTER — Ambulatory Visit
Admission: RE | Admit: 2022-01-04 | Discharge: 2022-01-04 | Disposition: A | Discharge status: Home / Self Care | Payer: PPO | Source: Ambulatory Visit | Attending: Internal Medicine | Admitting: Internal Medicine

## 2022-01-04 DIAGNOSIS — M542 Cervicalgia: Secondary | ICD-10-CM

## 2022-01-05 ENCOUNTER — Other Ambulatory Visit: Payer: Self-pay | Admitting: Internal Medicine

## 2022-01-05 DIAGNOSIS — R519 Headache, unspecified: Secondary | ICD-10-CM

## 2022-01-06 ENCOUNTER — Ambulatory Visit
Admission: RE | Admit: 2022-01-06 | Discharge: 2022-01-06 | Disposition: A | Discharge status: Home / Self Care | Payer: PPO | Source: Ambulatory Visit | Attending: Internal Medicine | Admitting: Internal Medicine

## 2022-01-06 ENCOUNTER — Other Ambulatory Visit: Payer: Self-pay

## 2022-01-06 DIAGNOSIS — R519 Headache, unspecified: Secondary | ICD-10-CM | POA: Diagnosis not present

## 2022-01-06 MED ORDER — GADOBENATE DIMEGLUMINE 529 MG/ML IV SOLN
14.0000 mL | Freq: Once | INTRAVENOUS | Status: AC | PRN
  Administered 2022-01-06: 14 mL via INTRAVENOUS

## 2022-01-25 DIAGNOSIS — M545 Low back pain, unspecified: Secondary | ICD-10-CM | POA: Diagnosis not present

## 2022-02-16 DIAGNOSIS — M5116 Intervertebral disc disorders with radiculopathy, lumbar region: Secondary | ICD-10-CM | POA: Diagnosis not present

## 2022-02-16 DIAGNOSIS — M5416 Radiculopathy, lumbar region: Secondary | ICD-10-CM | POA: Diagnosis not present

## 2022-02-17 DIAGNOSIS — M47819 Spondylosis without myelopathy or radiculopathy, site unspecified: Secondary | ICD-10-CM | POA: Diagnosis not present

## 2022-02-17 DIAGNOSIS — M545 Low back pain, unspecified: Secondary | ICD-10-CM | POA: Diagnosis not present

## 2022-02-18 DIAGNOSIS — M25831 Other specified joint disorders, right wrist: Secondary | ICD-10-CM | POA: Diagnosis not present

## 2022-02-18 DIAGNOSIS — M25531 Pain in right wrist: Secondary | ICD-10-CM | POA: Diagnosis not present

## 2022-02-18 DIAGNOSIS — M1811 Unilateral primary osteoarthritis of first carpometacarpal joint, right hand: Secondary | ICD-10-CM | POA: Diagnosis not present

## 2022-02-23 DIAGNOSIS — M1811 Unilateral primary osteoarthritis of first carpometacarpal joint, right hand: Secondary | ICD-10-CM | POA: Diagnosis not present

## 2022-02-23 DIAGNOSIS — M189 Osteoarthritis of first carpometacarpal joint, unspecified: Secondary | ICD-10-CM | POA: Diagnosis not present

## 2022-03-19 ENCOUNTER — Other Ambulatory Visit: Payer: Self-pay | Admitting: Internal Medicine

## 2022-03-19 DIAGNOSIS — Z1231 Encounter for screening mammogram for malignant neoplasm of breast: Secondary | ICD-10-CM

## 2022-04-06 ENCOUNTER — Ambulatory Visit: Payer: PPO

## 2022-04-07 ENCOUNTER — Ambulatory Visit: Payer: PPO

## 2022-04-20 DIAGNOSIS — M79661 Pain in right lower leg: Secondary | ICD-10-CM | POA: Diagnosis not present

## 2022-04-26 ENCOUNTER — Ambulatory Visit: Payer: PPO | Admitting: Podiatry

## 2022-04-27 ENCOUNTER — Ambulatory Visit
Admission: RE | Admit: 2022-04-27 | Discharge: 2022-04-27 | Disposition: A | Discharge status: Home / Self Care | Payer: PPO | Source: Ambulatory Visit | Attending: Internal Medicine | Admitting: Internal Medicine

## 2022-04-27 DIAGNOSIS — Z1231 Encounter for screening mammogram for malignant neoplasm of breast: Secondary | ICD-10-CM | POA: Diagnosis not present

## 2022-05-19 DIAGNOSIS — E785 Hyperlipidemia, unspecified: Secondary | ICD-10-CM | POA: Diagnosis not present

## 2022-05-19 DIAGNOSIS — R7989 Other specified abnormal findings of blood chemistry: Secondary | ICD-10-CM | POA: Diagnosis not present

## 2022-05-19 DIAGNOSIS — M810 Age-related osteoporosis without current pathological fracture: Secondary | ICD-10-CM | POA: Diagnosis not present

## 2022-05-26 DIAGNOSIS — I2510 Atherosclerotic heart disease of native coronary artery without angina pectoris: Secondary | ICD-10-CM | POA: Diagnosis not present

## 2022-05-26 DIAGNOSIS — E785 Hyperlipidemia, unspecified: Secondary | ICD-10-CM | POA: Diagnosis not present

## 2022-05-26 DIAGNOSIS — Z0000 Encounter for general adult medical examination without abnormal findings: Secondary | ICD-10-CM | POA: Diagnosis not present

## 2022-05-26 DIAGNOSIS — M719 Bursopathy, unspecified: Secondary | ICD-10-CM | POA: Diagnosis not present

## 2022-05-26 DIAGNOSIS — K838 Other specified diseases of biliary tract: Secondary | ICD-10-CM | POA: Diagnosis not present

## 2022-05-26 DIAGNOSIS — M810 Age-related osteoporosis without current pathological fracture: Secondary | ICD-10-CM | POA: Diagnosis not present

## 2022-05-26 DIAGNOSIS — M5416 Radiculopathy, lumbar region: Secondary | ICD-10-CM | POA: Diagnosis not present

## 2022-05-26 DIAGNOSIS — Z1331 Encounter for screening for depression: Secondary | ICD-10-CM | POA: Diagnosis not present

## 2022-05-26 DIAGNOSIS — R82998 Other abnormal findings in urine: Secondary | ICD-10-CM | POA: Diagnosis not present

## 2022-05-26 DIAGNOSIS — Z1339 Encounter for screening examination for other mental health and behavioral disorders: Secondary | ICD-10-CM | POA: Diagnosis not present

## 2022-06-02 DIAGNOSIS — H16012 Central corneal ulcer, left eye: Secondary | ICD-10-CM | POA: Diagnosis not present

## 2022-06-04 ENCOUNTER — Other Ambulatory Visit (HOSPITAL_COMMUNITY): Payer: Self-pay | Admitting: *Deleted

## 2022-06-07 ENCOUNTER — Encounter (HOSPITAL_COMMUNITY)
Admission: RE | Admit: 2022-06-07 | Discharge: 2022-06-07 | Disposition: A | Discharge status: Home / Self Care | Payer: PPO | Source: Ambulatory Visit | Attending: Internal Medicine | Admitting: Internal Medicine

## 2022-06-07 DIAGNOSIS — M719 Bursopathy, unspecified: Secondary | ICD-10-CM | POA: Diagnosis not present

## 2022-06-07 DIAGNOSIS — M810 Age-related osteoporosis without current pathological fracture: Secondary | ICD-10-CM | POA: Diagnosis not present

## 2022-06-07 DIAGNOSIS — R269 Unspecified abnormalities of gait and mobility: Secondary | ICD-10-CM | POA: Diagnosis not present

## 2022-06-07 DIAGNOSIS — M25551 Pain in right hip: Secondary | ICD-10-CM | POA: Diagnosis not present

## 2022-06-07 MED ORDER — DENOSUMAB 60 MG/ML ~~LOC~~ SOSY
PREFILLED_SYRINGE | SUBCUTANEOUS | Status: AC
Start: 2022-06-07 — End: 2022-06-07
  Filled 2022-06-07: qty 1

## 2022-06-07 MED ORDER — DENOSUMAB 60 MG/ML ~~LOC~~ SOSY
60.0000 mg | PREFILLED_SYRINGE | Freq: Once | SUBCUTANEOUS | Status: AC
  Administered 2022-06-07: 60 mg via SUBCUTANEOUS

## 2022-06-17 DIAGNOSIS — M5116 Intervertebral disc disorders with radiculopathy, lumbar region: Secondary | ICD-10-CM | POA: Diagnosis not present

## 2022-06-17 DIAGNOSIS — M5416 Radiculopathy, lumbar region: Secondary | ICD-10-CM | POA: Diagnosis not present

## 2022-08-05 DIAGNOSIS — H5213 Myopia, bilateral: Secondary | ICD-10-CM | POA: Diagnosis not present

## 2022-08-05 DIAGNOSIS — H1789 Other corneal scars and opacities: Secondary | ICD-10-CM | POA: Diagnosis not present

## 2022-08-05 DIAGNOSIS — H2513 Age-related nuclear cataract, bilateral: Secondary | ICD-10-CM | POA: Diagnosis not present

## 2022-08-12 DIAGNOSIS — Z23 Encounter for immunization: Secondary | ICD-10-CM | POA: Diagnosis not present

## 2022-08-12 DIAGNOSIS — H16042 Marginal corneal ulcer, left eye: Secondary | ICD-10-CM | POA: Diagnosis not present

## 2022-08-26 DIAGNOSIS — H1789 Other corneal scars and opacities: Secondary | ICD-10-CM | POA: Diagnosis not present

## 2022-11-23 ENCOUNTER — Ambulatory Visit: Payer: PPO | Admitting: Neurology

## 2022-11-26 ENCOUNTER — Other Ambulatory Visit: Payer: Self-pay | Admitting: Neurology

## 2022-12-14 DIAGNOSIS — L821 Other seborrheic keratosis: Secondary | ICD-10-CM | POA: Diagnosis not present

## 2022-12-14 DIAGNOSIS — L820 Inflamed seborrheic keratosis: Secondary | ICD-10-CM | POA: Diagnosis not present

## 2022-12-14 DIAGNOSIS — L814 Other melanin hyperpigmentation: Secondary | ICD-10-CM | POA: Diagnosis not present

## 2022-12-15 DIAGNOSIS — G4733 Obstructive sleep apnea (adult) (pediatric): Secondary | ICD-10-CM | POA: Diagnosis not present

## 2022-12-15 DIAGNOSIS — H9202 Otalgia, left ear: Secondary | ICD-10-CM | POA: Diagnosis not present

## 2022-12-15 DIAGNOSIS — M810 Age-related osteoporosis without current pathological fracture: Secondary | ICD-10-CM | POA: Diagnosis not present

## 2022-12-15 DIAGNOSIS — M1811 Unilateral primary osteoarthritis of first carpometacarpal joint, right hand: Secondary | ICD-10-CM | POA: Diagnosis not present

## 2022-12-15 DIAGNOSIS — G4700 Insomnia, unspecified: Secondary | ICD-10-CM | POA: Diagnosis not present

## 2022-12-15 DIAGNOSIS — M25831 Other specified joint disorders, right wrist: Secondary | ICD-10-CM | POA: Diagnosis not present

## 2022-12-15 DIAGNOSIS — H9012 Conductive hearing loss, unilateral, left ear, with unrestricted hearing on the contralateral side: Secondary | ICD-10-CM | POA: Diagnosis not present

## 2022-12-15 DIAGNOSIS — E785 Hyperlipidemia, unspecified: Secondary | ICD-10-CM | POA: Diagnosis not present

## 2022-12-15 DIAGNOSIS — B0229 Other postherpetic nervous system involvement: Secondary | ICD-10-CM | POA: Diagnosis not present

## 2022-12-16 DIAGNOSIS — M5416 Radiculopathy, lumbar region: Secondary | ICD-10-CM | POA: Diagnosis not present

## 2022-12-16 DIAGNOSIS — M5116 Intervertebral disc disorders with radiculopathy, lumbar region: Secondary | ICD-10-CM | POA: Diagnosis not present

## 2023-01-04 ENCOUNTER — Other Ambulatory Visit (HOSPITAL_COMMUNITY): Payer: Self-pay

## 2023-01-05 ENCOUNTER — Encounter (HOSPITAL_COMMUNITY): Payer: PPO

## 2023-01-05 DIAGNOSIS — H9202 Otalgia, left ear: Secondary | ICD-10-CM | POA: Diagnosis not present

## 2023-01-05 DIAGNOSIS — H9012 Conductive hearing loss, unilateral, left ear, with unrestricted hearing on the contralateral side: Secondary | ICD-10-CM | POA: Diagnosis not present

## 2023-01-06 ENCOUNTER — Ambulatory Visit (HOSPITAL_COMMUNITY)
Admission: RE | Admit: 2023-01-06 | Discharge: 2023-01-06 | Disposition: A | Discharge status: Home / Self Care | Payer: Medicare Other | Source: Ambulatory Visit | Attending: Internal Medicine | Admitting: Internal Medicine

## 2023-01-06 DIAGNOSIS — M810 Age-related osteoporosis without current pathological fracture: Secondary | ICD-10-CM | POA: Diagnosis not present

## 2023-01-06 MED ORDER — DENOSUMAB 60 MG/ML ~~LOC~~ SOSY
60.0000 mg | PREFILLED_SYRINGE | Freq: Once | SUBCUTANEOUS | Status: AC
  Administered 2023-01-06: 60 mg via SUBCUTANEOUS

## 2023-01-06 MED ORDER — DENOSUMAB 60 MG/ML ~~LOC~~ SOSY
PREFILLED_SYRINGE | SUBCUTANEOUS | Status: AC
  Filled 2023-01-06: qty 1

## 2023-02-08 ENCOUNTER — Ambulatory Visit: Payer: PPO | Admitting: Neurology

## 2023-02-28 ENCOUNTER — Telehealth: Payer: Self-pay | Admitting: Neurology

## 2023-02-28 ENCOUNTER — Telehealth (INDEPENDENT_AMBULATORY_CARE_PROVIDER_SITE_OTHER): Payer: Medicare Other | Admitting: Neurology

## 2023-02-28 ENCOUNTER — Encounter: Payer: Self-pay | Admitting: Neurology

## 2023-02-28 DIAGNOSIS — G4733 Obstructive sleep apnea (adult) (pediatric): Secondary | ICD-10-CM | POA: Diagnosis not present

## 2023-02-28 DIAGNOSIS — G478 Other sleep disorders: Secondary | ICD-10-CM

## 2023-02-28 NOTE — Progress Notes (Signed)
Virtual Visit via Video Note  I connected with Stacey Short on 02/28/23 at  2:30 PM EDT by a video enabled telemedicine application and verified that I am speaking with the correct person using two identifiers.  Location: Patient: at her residence  Provider: At [facility] office    I discussed the limitations of evaluation and management by telemedicine and the availability of in person appointments. The patient expressed understanding and agreed to proceed.  History of Present Illness: The patient is established at [facility] for OSA on CPAP.   The patient is seen here for her yearly follow-up visit on 28 Feb 2023 .  Her set up was following a home sleep study in July 2020 and at that time she had an AHI of 30 and an REM AHI of 47.8.  She uses an autotitration device S 10 and is going to need new supplies soon, also needs a new machine in 2025. CPAP compliance data come from 80% compliance by days.  These data only reflects the use of her main CPAP machine but the patient also uses a travel CPAP by [employer] which is set at 10 cm water pressure.  So her compliance is most likely around 93 or 97%.  The average user time is 7 hours 21 minutes.  The current settings are between 7 and 14 cm water pressure with 3 cm expiratory relief.  Her residual AHI is 0.6/h and her 95th percentile pressure 9.8 cm water this should be the setting on her travel CPAP as well.  She requested a DME order for a new interface.  Her DME would be [employer].  The patient reports no problems with continued CPAP use, no aerophagia, no condensation water.   The patient was originally referred by her primary care physician, Dr. Guillermina City.  Her last visit with me was in February 2023.   Stacey Short is a 77 y.o. female patient, and here for her regular OSA follow up, and she is a patient of Dr. Waynard Edwards. She underwent a HST last time, and this confirmed OSA; she has 2 CPAPs one regular and one for travel.  AHI was   13.7/h ,  REM sleep apnea: 27/h. No prolonged hypoxia. Still using a headgear, never switched to the bella swift. Asking about a dental guard.    Stacey Short has been using her home CPAP machine 16 out of 30 days but she also owns a travel CPAP, but she also owns a travel CPAP. Her compliance is therefore spotty - average 6 h 19 minutes of nightly use, residual AHI of 0.8/h.  The 95th percentile pressure is 9.6 cm water.  Minimum pressure setting of 7 maximum of 14 cm of water with 3 cm EPR.  This is comfortable and we will not need to change it.  The machine is only 76 years old so at this time there is no need to replace replace it yet.  A dental device for a patient with REM accentuated apnea will do usually not reach a decrease in apnea by more than 70%. .       08-19-2020: Stacey Short has been a highly compliant CPAP user but she was last seen in our office this time her compliance to be merged between her travel CPAP and her at home standard CPAP.  She is using an autotitrator at home with a minimum pressure setting of 7 maximum of 14 cmH2O 3 cm expiratory pressure relief.  Her average  user time is 6 hours 28 minutes.  Again emerging the data she would be a 97% compliant patient she has a residual AHI of only 0.6/h her 95th percentile pressure on auto titration is 9.8 cmH2O there are minimal air leaks. Mask used is a nasal pillow. P 10- medium. Dr. Neva Seat , her husband , has been part time working for research company- and has been sleeping better since she is treated.       Observations/Objective: See above Compliance data for CPAP which were excellent with a residual AHI of only 0.6/h.  The patient does not report fatigue and less physical activity has driven her to exhaustion.  She does not take naps and daytime she sleeps about 7 to 7-1/2 hours at night she usually wakes up in the morning spontaneously at about 530.   Assessment and Plan: Continue CPAP use with the current settings I will  order a home sleep test for January  - February next year, so that it qualifies her for a new CPAP machine within the 6 months window. Replacement in July 2025.    Follow Up Instructions: see above instruction for a HST prior to replacing the CPAP.  RV in May 2025.     I discussed the assessment and treatment plan with the patient. The patient was provided an opportunity to ask questions and all were answered. The patient agreed with the plan and demonstrated an understanding of the instructions.   The patient was advised to call back or seek an in-person evaluation if the symptoms worsen or if the condition fails to improve as anticipated.  I provided 20 minutes of non-face-to-face time during this encounter.   Melvyn Novas, MD

## 2023-02-28 NOTE — Patient Instructions (Signed)
The patient is seen here for her yearly follow-up visit on 28 Feb 2023 .  Her set up was following a home sleep study in July 2020 and at that time she had an AHI of 30 and an REM AHI of 47.8.   She uses an autotitration device S 10 and is going to need new supplies soon, also needs a new machine in 2025.  HST will be needed not in October of this year but in February- May 2025, to have the new machine set up for replacement by July 2025.

## 2023-02-28 NOTE — Telephone Encounter (Signed)
Noted patient data is online and can complete [facility]. I will forward a epworth for the patient to complete prior to apt.

## 2023-03-02 DIAGNOSIS — M47812 Spondylosis without myelopathy or radiculopathy, cervical region: Secondary | ICD-10-CM | POA: Diagnosis not present

## 2023-03-02 DIAGNOSIS — G500 Trigeminal neuralgia: Secondary | ICD-10-CM | POA: Diagnosis not present

## 2023-03-02 DIAGNOSIS — J302 Other seasonal allergic rhinitis: Secondary | ICD-10-CM | POA: Diagnosis not present

## 2023-03-02 DIAGNOSIS — H6982 Other specified disorders of Eustachian tube, left ear: Secondary | ICD-10-CM | POA: Diagnosis not present

## 2023-03-02 DIAGNOSIS — H9202 Otalgia, left ear: Secondary | ICD-10-CM | POA: Diagnosis not present

## 2023-03-15 ENCOUNTER — Telehealth: Payer: Self-pay | Admitting: Neurology

## 2023-03-15 NOTE — Telephone Encounter (Signed)
03/15/23 LVM [place] 03/14/23 [employer] medicare no auth req EE

## 2023-03-21 DIAGNOSIS — M5116 Intervertebral disc disorders with radiculopathy, lumbar region: Secondary | ICD-10-CM | POA: Diagnosis not present

## 2023-03-23 DIAGNOSIS — M5416 Radiculopathy, lumbar region: Secondary | ICD-10-CM | POA: Diagnosis not present

## 2023-04-04 ENCOUNTER — Other Ambulatory Visit: Payer: Self-pay | Admitting: Internal Medicine

## 2023-04-04 DIAGNOSIS — Z1231 Encounter for screening mammogram for malignant neoplasm of breast: Secondary | ICD-10-CM

## 2023-04-06 ENCOUNTER — Ambulatory Visit: Payer: PPO | Admitting: Neurology

## 2023-04-08 DIAGNOSIS — G4733 Obstructive sleep apnea (adult) (pediatric): Secondary | ICD-10-CM | POA: Diagnosis not present

## 2023-05-06 ENCOUNTER — Ambulatory Visit: Admission: RE | Admit: 2023-05-06 | Payer: Medicare Other | Source: Ambulatory Visit

## 2023-05-06 DIAGNOSIS — Z1231 Encounter for screening mammogram for malignant neoplasm of breast: Secondary | ICD-10-CM

## 2023-06-08 DIAGNOSIS — M5431 Sciatica, right side: Secondary | ICD-10-CM | POA: Diagnosis not present

## 2023-06-08 DIAGNOSIS — M1712 Unilateral primary osteoarthritis, left knee: Secondary | ICD-10-CM | POA: Diagnosis not present

## 2023-06-09 DIAGNOSIS — L299 Pruritus, unspecified: Secondary | ICD-10-CM | POA: Diagnosis not present

## 2023-06-09 DIAGNOSIS — T781XXD Other adverse food reactions, not elsewhere classified, subsequent encounter: Secondary | ICD-10-CM | POA: Diagnosis not present

## 2023-06-09 DIAGNOSIS — R21 Rash and other nonspecific skin eruption: Secondary | ICD-10-CM | POA: Diagnosis not present

## 2023-06-09 DIAGNOSIS — J310 Chronic rhinitis: Secondary | ICD-10-CM | POA: Diagnosis not present

## 2023-07-13 ENCOUNTER — Ambulatory Visit: Payer: Medicare Other | Admitting: Internal Medicine

## 2023-07-15 DIAGNOSIS — Z0000 Encounter for general adult medical examination without abnormal findings: Secondary | ICD-10-CM | POA: Diagnosis not present

## 2023-07-18 ENCOUNTER — Other Ambulatory Visit (HOSPITAL_COMMUNITY): Payer: Self-pay | Admitting: *Deleted

## 2023-07-21 ENCOUNTER — Ambulatory Visit (HOSPITAL_COMMUNITY)
Admission: RE | Admit: 2023-07-21 | Discharge: 2023-07-21 | Disposition: A | Discharge status: Home / Self Care | Payer: Medicare Other | Source: Ambulatory Visit | Attending: Internal Medicine | Admitting: Internal Medicine

## 2023-07-21 DIAGNOSIS — M810 Age-related osteoporosis without current pathological fracture: Secondary | ICD-10-CM | POA: Insufficient documentation

## 2023-07-21 DIAGNOSIS — Z0000 Encounter for general adult medical examination without abnormal findings: Secondary | ICD-10-CM | POA: Diagnosis not present

## 2023-07-21 DIAGNOSIS — R82998 Other abnormal findings in urine: Secondary | ICD-10-CM | POA: Diagnosis not present

## 2023-07-21 MED ORDER — DENOSUMAB 60 MG/ML ~~LOC~~ SOSY
PREFILLED_SYRINGE | SUBCUTANEOUS | Status: AC
  Filled 2023-07-21: qty 1

## 2023-07-21 MED ORDER — DENOSUMAB 60 MG/ML ~~LOC~~ SOSY
60.0000 mg | PREFILLED_SYRINGE | Freq: Once | SUBCUTANEOUS | Status: AC
  Administered 2023-07-21: 60 mg via SUBCUTANEOUS

## 2023-08-12 DIAGNOSIS — M5431 Sciatica, right side: Secondary | ICD-10-CM | POA: Diagnosis not present

## 2023-09-20 DIAGNOSIS — Z1389 Encounter for screening for other disorder: Secondary | ICD-10-CM | POA: Diagnosis not present

## 2023-09-20 DIAGNOSIS — R3589 Other polyuria: Secondary | ICD-10-CM | POA: Diagnosis not present

## 2024-02-16 ENCOUNTER — Encounter (HOSPITAL_COMMUNITY)

## 2024-02-16 ENCOUNTER — Other Ambulatory Visit (HOSPITAL_COMMUNITY): Payer: Self-pay

## 2024-03-15 ENCOUNTER — Encounter (HOSPITAL_COMMUNITY)

## 2024-03-20 ENCOUNTER — Other Ambulatory Visit (HOSPITAL_COMMUNITY): Payer: Self-pay | Admitting: *Deleted

## 2024-03-22 ENCOUNTER — Encounter (HOSPITAL_COMMUNITY)
Admission: RE | Admit: 2024-03-22 | Discharge: 2024-03-22 | Disposition: A | Discharge status: Home / Self Care | Source: Ambulatory Visit | Attending: Internal Medicine | Admitting: Internal Medicine

## 2024-03-22 DIAGNOSIS — M810 Age-related osteoporosis without current pathological fracture: Secondary | ICD-10-CM | POA: Diagnosis present

## 2024-03-22 MED ORDER — DENOSUMAB 60 MG/ML ~~LOC~~ SOSY
PREFILLED_SYRINGE | SUBCUTANEOUS | Status: AC
  Filled 2024-03-22: qty 1

## 2024-03-22 MED ORDER — DENOSUMAB 60 MG/ML ~~LOC~~ SOSY
60.0000 mg | PREFILLED_SYRINGE | Freq: Once | SUBCUTANEOUS | Status: AC
  Administered 2024-03-22: 60 mg via SUBCUTANEOUS

## 2024-04-19 ENCOUNTER — Other Ambulatory Visit: Payer: Self-pay | Admitting: Internal Medicine

## 2024-04-19 DIAGNOSIS — Z1231 Encounter for screening mammogram for malignant neoplasm of breast: Secondary | ICD-10-CM

## 2024-05-07 ENCOUNTER — Ambulatory Visit

## 2024-05-24 ENCOUNTER — Ambulatory Visit
Admission: RE | Admit: 2024-05-24 | Discharge: 2024-05-24 | Disposition: A | Discharge status: Home / Self Care | Source: Ambulatory Visit | Attending: Internal Medicine | Admitting: Internal Medicine

## 2024-05-24 ENCOUNTER — Ambulatory Visit

## 2024-05-24 DIAGNOSIS — Z1231 Encounter for screening mammogram for malignant neoplasm of breast: Secondary | ICD-10-CM

## 2024-10-01 ENCOUNTER — Other Ambulatory Visit (HOSPITAL_COMMUNITY): Payer: Self-pay | Admitting: Internal Medicine

## 2024-10-01 DIAGNOSIS — M810 Age-related osteoporosis without current pathological fracture: Secondary | ICD-10-CM | POA: Insufficient documentation

## 2024-10-02 ENCOUNTER — Telehealth (HOSPITAL_COMMUNITY): Payer: Self-pay | Admitting: Pharmacy Technician

## 2024-10-02 NOTE — Telephone Encounter (Signed)
 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

## 2024-10-10 ENCOUNTER — Ambulatory Visit (HOSPITAL_COMMUNITY)
Admission: RE | Admit: 2024-10-10 | Discharge: 2024-10-10 | Disposition: A | Discharge status: Home / Self Care | Source: Ambulatory Visit | Attending: Internal Medicine | Admitting: Internal Medicine

## 2024-10-10 VITALS — BP 122/71 | HR 69 | Temp 97.1°F | Resp 15

## 2024-10-10 DIAGNOSIS — M810 Age-related osteoporosis without current pathological fracture: Secondary | ICD-10-CM | POA: Insufficient documentation

## 2024-10-10 MED ORDER — DENOSUMAB 60 MG/ML ~~LOC~~ SOSY
PREFILLED_SYRINGE | SUBCUTANEOUS | Status: AC
  Filled 2024-10-10: qty 1

## 2024-10-10 MED ORDER — DENOSUMAB 60 MG/ML ~~LOC~~ SOSY
60.0000 mg | PREFILLED_SYRINGE | Freq: Once | SUBCUTANEOUS | Status: AC
  Administered 2024-10-10: 60 mg via SUBCUTANEOUS

## 2024-10-31 ENCOUNTER — Ambulatory Visit: Admitting: Gastroenterology

## 2025-04-11 ENCOUNTER — Encounter (HOSPITAL_COMMUNITY)
